# Patient Record
Sex: Female | Born: 1995 | Race: Black or African American | Hispanic: No | Marital: Single
Health system: Midwestern US, Community
[De-identification: ages and names within clinical notes are randomized; demographics above are authoritative.]

---

## 2014-12-10 ENCOUNTER — Inpatient Hospital Stay
Admit: 2014-12-10 | Discharge: 2014-12-10 | Disposition: A | Discharge status: Home / Self Care | Payer: BLUE CROSS/BLUE SHIELD | Attending: Pediatric Emergency Medicine

## 2014-12-10 DIAGNOSIS — R112 Nausea with vomiting, unspecified: Secondary | ICD-10-CM

## 2014-12-10 LAB — CBC WITH AUTOMATED DIFF
ABS. BASOPHILS: 0 10*3/uL (ref 0.0–0.1)
ABS. EOSINOPHILS: 0.1 10*3/uL (ref 0.0–0.4)
ABS. LYMPHOCYTES: 0.7 10*3/uL — ABNORMAL LOW (ref 0.8–3.5)
ABS. MONOCYTES: 0.5 10*3/uL (ref 0.0–1.0)
ABS. NEUTROPHILS: 3.2 10*3/uL (ref 1.8–8.0)
BASOPHILS: 0 % (ref 0–1)
EOSINOPHILS: 2 % (ref 0–7)
HCT: 38.5 % (ref 35.0–47.0)
HGB: 12.6 g/dL (ref 11.5–16.0)
LYMPHOCYTES: 16 % (ref 12–49)
MCH: 29.4 PG (ref 26.0–34.0)
MCHC: 32.7 g/dL (ref 30.0–36.5)
MCV: 90 FL (ref 80.0–99.0)
MONOCYTES: 12 % (ref 5–13)
NEUTROPHILS: 70 % (ref 32–75)
PLATELET: 187 10*3/uL (ref 150–400)
RBC: 4.28 M/uL (ref 3.80–5.20)
RDW: 14.1 % (ref 11.5–14.5)
WBC: 4.5 10*3/uL (ref 3.6–11.0)

## 2014-12-10 LAB — URINALYSIS W/ REFLEX CULTURE
Bilirubin: NEGATIVE
Blood: NEGATIVE
Glucose: NEGATIVE mg/dL
Ketone: NEGATIVE mg/dL
Nitrites: NEGATIVE
Protein: NEGATIVE mg/dL
Specific gravity: 1.024 (ref 1.003–1.030)
Urobilinogen: 1 EU/dL (ref 0.2–1.0)
pH (UA): 7 (ref 5.0–8.0)

## 2014-12-10 LAB — METABOLIC PANEL, COMPREHENSIVE
A-G Ratio: 0.9 — ABNORMAL LOW (ref 1.1–2.2)
ALT (SGPT): 14 U/L (ref 12–78)
AST (SGOT): 9 U/L — ABNORMAL LOW (ref 15–37)
Albumin: 3.5 g/dL (ref 3.5–5.0)
Alk. phosphatase: 64 U/L (ref 45–117)
Anion gap: 8 mmol/L (ref 5–15)
BUN/Creatinine ratio: 10 — ABNORMAL LOW (ref 12–20)
BUN: 7 MG/DL (ref 6–20)
Bilirubin, total: 1.6 MG/DL — ABNORMAL HIGH (ref 0.2–1.0)
CO2: 24 mmol/L (ref 21–32)
Calcium: 8.2 MG/DL — ABNORMAL LOW (ref 8.5–10.1)
Chloride: 107 mmol/L (ref 97–108)
Creatinine: 0.67 MG/DL (ref 0.55–1.02)
GFR est AA: >60 mL/min/{1.73_m2} (ref >60)
GFR est non-AA: >60 mL/min/{1.73_m2} (ref >60)
Globulin: 3.7 g/dL (ref 2.0–4.0)
Glucose: 94 mg/dL (ref 65–100)
Potassium: 3.7 mmol/L (ref 3.5–5.1)
Protein, total: 7.2 g/dL (ref 6.4–8.2)
Sodium: 139 mmol/L (ref 136–145)

## 2014-12-10 LAB — LIPASE: Lipase: 81 U/L (ref 73–393)

## 2014-12-10 LAB — HCG URINE, QL. - POC: Pregnancy test,urine (POC): NEGATIVE

## 2014-12-10 MED ORDER — ONDANSETRON 4 MG TAB, RAPID DISSOLVE
4 mg | ORAL_TABLET | Freq: Three times a day (TID) | ORAL | Status: AC | PRN
Start: 2014-12-10 — End: 2014-12-12

## 2014-12-10 MED ORDER — ONDANSETRON 4 MG TAB, RAPID DISSOLVE
4 mg | ORAL | Status: AC
Start: 2014-12-10 — End: 2014-12-10
  Administered 2014-12-10: 13:00:00 via ORAL

## 2014-12-10 MED ORDER — SODIUM CHLORIDE 0.9% BOLUS IV
0.9 % | Freq: Once | INTRAVENOUS | Status: AC
Start: 2014-12-10 — End: 2014-12-10
  Administered 2014-12-10: 16:00:00 via INTRAVENOUS

## 2014-12-10 MED ORDER — ONDANSETRON (PF) 4 MG/2 ML INJECTION
4 mg/2 mL | INTRAMUSCULAR | Status: AC
Start: 2014-12-10 — End: 2014-12-10
  Administered 2014-12-10: 16:00:00 via INTRAVENOUS

## 2014-12-10 MED FILL — ONDANSETRON 4 MG TAB, RAPID DISSOLVE: 4 mg | ORAL | Qty: 1

## 2014-12-10 MED FILL — SODIUM CHLORIDE 0.9 % IV: INTRAVENOUS | Qty: 1000

## 2014-12-10 MED FILL — ONDANSETRON (PF) 4 MG/2 ML INJECTION: 4 mg/2 mL | INTRAMUSCULAR | Qty: 2

## 2014-12-10 NOTE — ED Notes (Signed)
Started vomiting this morning. C/o lower back pain and fatigue. Unknown if has fever.

## 2014-12-10 NOTE — ED Notes (Signed)
Pt. Discharged home with parent/guardian.  Pt. Acting age appropriately and respirations regular and unlabored.  Parent/guardian verbalized understanding of discharge instructions and has no further questions at this time.

## 2014-12-10 NOTE — ED Notes (Signed)
Patient drinking apple juice and eating captain crunch.

## 2014-12-10 NOTE — ED Provider Notes (Signed)
HPI Comments: 19 yo woman with no PMH presents for evaluation of lower back pain and vomiting starting this morning.  No fevers, no dysuria, no diarrhea.  Emesis NB/NB.  Last BM yesterday.  Back pain mild to moderate, bilateral, dull, without radiation or sciatica.  Took no medications.  Denies vaginal discharge or pain, no abd pain.  No numbness, tingling or weakness.     LMP 1 month ago.  Denies sexual activity.  Family history unremarkable.    Patient is a 19 y.o. female presenting with vomiting.   Vomiting   Pertinent negatives include no fever, no abdominal pain, no diarrhea and no cough.        Past Medical History:   Diagnosis Date   ??? Ill-defined condition      uti       Past Surgical History:   Procedure Laterality Date   ??? Hx heent       tonsilectomy         History reviewed. No pertinent family history.    History     Social History   ??? Marital Status: SINGLE     Spouse Name: N/A   ??? Number of Children: N/A   ??? Years of Education: N/A     Occupational History   ??? Not on file.     Social History Main Topics   ??? Smoking status: Not on file   ??? Smokeless tobacco: Not on file   ??? Alcohol Use: Not on file   ??? Drug Use: Not on file   ??? Sexual Activity: Not on file     Other Topics Concern   ??? Not on file     Social History Narrative   ??? No narrative on file           ALLERGIES: Pcn; Peach flavor; and Strawberry      Review of Systems   Constitutional: Negative for fever and appetite change.   HENT: Negative for congestion and rhinorrhea.    Eyes: Negative for discharge and redness.   Respiratory: Negative for cough and shortness of breath.    Gastrointestinal: Positive for vomiting. Negative for nausea, abdominal pain and diarrhea.   Genitourinary: Negative for dysuria and decreased urine volume.   Musculoskeletal: Positive for back pain. Negative for neck pain and neck stiffness.   Skin: Negative for rash and wound.   Hematological: Does not bruise/bleed easily.    All other systems reviewed and are negative.      Filed Vitals:    12/10/14 0904   BP: 115/64   Pulse: 92   Temp: 98.2 ??F (36.8 ??C)   Resp: 18   Weight: 135.9 kg (299 lb 9.7 oz)   SpO2: 97%            Physical Exam   Constitutional: She is oriented to person, place, and time. She appears well-nourished. No distress.   HENT:   Head: Normocephalic and atraumatic.   Right Ear: External ear normal.   Left Ear: External ear normal.   Nose: Nose normal.   Mouth/Throat: Oropharynx is clear and moist. No oropharyngeal exudate.   Eyes: Conjunctivae and EOM are normal. Pupils are equal, round, and reactive to light. Right eye exhibits no discharge. Left eye exhibits no discharge. No scleral icterus.   Neck: Normal range of motion. Neck supple.   Cardiovascular: Normal rate, regular rhythm, normal heart sounds and intact distal pulses.  Exam reveals no gallop and no friction rub.    No  murmur heard.  Pulmonary/Chest: Effort normal and breath sounds normal. No respiratory distress. She has no wheezes. She has no rales. She exhibits no tenderness.   Abdominal: Soft. She exhibits no distension and no mass. Bowel sounds are increased. There is no tenderness. There is no rebound and no guarding.   Musculoskeletal: Normal range of motion. She exhibits no edema.   Neurological: She is alert and oriented to person, place, and time. She has normal strength. No cranial nerve deficit. She exhibits normal muscle tone.   Skin: Skin is warm and dry. No rash noted. She is not diaphoretic.   Psychiatric: She has a normal mood and affect. Her behavior is normal.   Nursing note and vitals reviewed.       MDM    Procedures    Pt was re-evaluated after zofran and IVF.  The patient has tolerated PO without further emesis.  Patient is well hydrated, well appearing, and in no respiratory distress. Physical exam is reassuring, and without signs of serious illness.  Symptoms likely secondary to a viral syndrome.  Will  discharge patient home with zofran, supportive care, and follow-up with PCP within the next few days.

## 2014-12-11 LAB — CULTURE, URINE
Colonies Counted: 70000
Colony Count: 70000

## 2015-11-27 ENCOUNTER — Inpatient Hospital Stay
Admit: 2015-11-27 | Discharge: 2015-11-27 | Disposition: A | Discharge status: Home / Self Care | Payer: BLUE CROSS/BLUE SHIELD | Attending: Emergency Medicine

## 2015-11-27 ENCOUNTER — Emergency Department: Admit: 2015-11-27 | Payer: BLUE CROSS/BLUE SHIELD

## 2015-11-27 DIAGNOSIS — J069 Acute upper respiratory infection, unspecified: Secondary | ICD-10-CM

## 2015-11-27 LAB — POC GROUP A STREP: Group A strep (POC): NEGATIVE

## 2015-11-27 LAB — URINALYSIS W/ REFLEX CULTURE
Bilirubin: NEGATIVE
Glucose: NEGATIVE mg/dL
Nitrites: NEGATIVE
Protein: NEGATIVE mg/dL
RBC: >100 /hpf — ABNORMAL HIGH (ref 0–5)
Specific gravity: 1.02 (ref 1.003–1.030)
Urobilinogen: 0.2 EU/dL (ref 0.2–1.0)
pH (UA): 6.5 (ref 5.0–8.0)

## 2015-11-27 LAB — HCG URINE, QL. - POC: Pregnancy test,urine (POC): NEGATIVE

## 2015-11-27 MED ORDER — TRIMETHOPRIM-SULFAMETHOXAZOLE 160 MG-800 MG TAB
160-800 mg | ORAL_TABLET | Freq: Two times a day (BID) | ORAL | 0 refills | Status: AC
Start: 2015-11-27 — End: 2015-12-04

## 2015-11-27 NOTE — ED Notes (Signed)
Patient verbalizes understanding of discharge instructions. Pt alert and oriented, appears in no acute distress, respirations equal and unlabored. Ambulatory upon discharge with steady gait.

## 2015-11-27 NOTE — ED Provider Notes (Signed)
HPI Comments: 20 yo female with hx of tonsillectomy here for evaluation of cough, congestion, ear ache, sore throat and chills over the past 3-4 days.  States fever of 101-102 at home.  Has been taking allergy meds and Dayquil.  +Cough; pain in chest with cough.   States she was told by job she had to be evaluated.    Denies SOB, abd pain, flank pain, urinary symptoms.   Non smoker.      Patient is a 20 y.o. female presenting with general illness. The history is provided by the patient.   Generalized Body Aches   The current episode started 2 days ago. The problem occurs constantly. Pertinent negatives include no abdominal pain and no shortness of breath. Nothing aggravates the symptoms. Nothing relieves the symptoms.        Past Medical History:   Diagnosis Date   ??? Ill-defined condition     uti       Past Surgical History:   Procedure Laterality Date   ??? HX HEENT      tonsilectomy         History reviewed. No pertinent family history.    Social History     Social History   ??? Marital status: SINGLE     Spouse name: N/A   ??? Number of children: N/A   ??? Years of education: N/A     Occupational History   ??? Not on file.     Social History Main Topics   ??? Smoking status: Not on file   ??? Smokeless tobacco: Not on file   ??? Alcohol use Not on file   ??? Drug use: Not on file   ??? Sexual activity: Not on file     Other Topics Concern   ??? Not on file     Social History Narrative         ALLERGIES: Pcn [penicillins]; Peach flavor; and Strawberry    Review of Systems   Constitutional: Positive for fever. Negative for activity change.   HENT: Positive for congestion, ear pain and sore throat. Negative for facial swelling.    Eyes: Negative for discharge.   Respiratory: Positive for cough. Negative for shortness of breath.    Cardiovascular: Negative for leg swelling.   Gastrointestinal: Negative for abdominal distention and abdominal pain.   Skin: Negative for color change.   Neurological: Negative for seizures and syncope.    Psychiatric/Behavioral: Negative for behavioral problems.       Vitals:    11/27/15 1813   BP: 130/79   Pulse: 76   Resp: 16   Temp: 98.6 ??F (37 ??C)   SpO2: 100%   Weight: 130.6 kg (288 lb)   Height: 5\' 7"  (1.702 m)            Physical Exam   Constitutional: She is oriented to person, place, and time. She appears well-developed and well-nourished.   HENT:   Head: Normocephalic and atraumatic.   Right Ear: External ear normal.   Left Ear: External ear normal.   Nose: Nose normal.   Mouth/Throat: Oropharynx is clear and moist. No oropharyngeal exudate.   Clear rhinorrhea   Eyes: Conjunctivae and EOM are normal. Pupils are equal, round, and reactive to light. Right eye exhibits no discharge. Left eye exhibits no discharge.   Neck: Normal range of motion. Neck supple.   No meningeal signs    Cardiovascular: Normal rate, regular rhythm, normal heart sounds and intact distal pulses.  Pulmonary/Chest: Effort normal and breath sounds normal.   Abdominal: Soft. Bowel sounds are normal. She exhibits no distension. There is no tenderness. There is no rebound and no guarding.   Musculoskeletal: Normal range of motion. She exhibits no edema or tenderness.   Lymphadenopathy:     She has no cervical adenopathy.   Neurological: She is alert and oriented to person, place, and time. No cranial nerve deficit. Coordination normal.   Skin: Skin is warm and dry. No rash noted.   Psychiatric: She has a normal mood and affect. Her behavior is normal. Judgment and thought content normal.   Nursing note and vitals reviewed.       MDM  Number of Diagnoses or Management Options  Acute upper respiratory infection:   Urinary tract infection with hematuria, site unspecified:      Amount and/or Complexity of Data Reviewed  Clinical lab tests: ordered and reviewed  Tests in the radiology section of CPT??: ordered and reviewed  Discuss the patient with other providers: yes      ED Course       Procedures       Patient has been reassessed.  Reviewed labs, medications and radiographics with patient.  Ready to discharge home.      Discussed case with attending Physician Marella Bile.  Agrees with care and will D/C with follow up.      Patient's results have been reviewed with them.  Patient and/or family have verbally conveyed their understanding and agreement of the patient's signs, symptoms, diagnosis, treatment and prognosis and additionally agree to follow up as recommended or return to the Emergency Room should their condition change prior to follow-up.  Discharge instructions have also been provided to the patient with some educational information regarding their diagnosis as well a list of reasons why they would want to return to the ER prior to their follow-up appointment should their condition change.  Veverly Fells, PA

## 2015-11-27 NOTE — Progress Notes (Signed)
rx bactrim, C&S pending

## 2015-11-27 NOTE — ED Notes (Signed)
PA reviewed discharge instructions with the patient.  The patient verbalized understanding.

## 2015-11-27 NOTE — ED Triage Notes (Signed)
+  nasal congestion. +right ear ache. +sore throat. +chills. +fever. Symptoms x 1 week.  Took allergy medication & dayquil & throat spray with no relief.  Also reports intermittent chest pain since last week

## 2015-11-27 NOTE — Progress Notes (Signed)
Treatment with bactrim appropriate per C&S

## 2015-11-29 LAB — CULTURE, URINE
Colonies Counted: >100000
Colony Count: >100000

## 2015-11-29 LAB — CULTURE, THROAT: Culture result:: NORMAL

## 2016-03-18 DIAGNOSIS — N760 Acute vaginitis: Secondary | ICD-10-CM

## 2016-03-18 NOTE — ED Notes (Addendum)
Assumed care of pt from triage. Pt complaining of chest pain this evening. Pt states "it felt like my heart was racing" and "I thought it might have been a panic attack". Pt also c/o urinary pain for a couple of days. Pt c/o burning with urination and difficulty urinating. Pt in no acute distress at this time. MD at bedside. Call bell within reach.

## 2016-03-18 NOTE — ED Notes (Signed)
Pt resting quietly and in no acute distress at this time. Family at bedside. Call bell within reach.

## 2016-03-18 NOTE — ED Provider Notes (Signed)
Patient is a 20 y.o. female presenting with chest pain and urinary pain. The history is provided by the patient.   Chest Pain (Angina)    This is a new problem. The current episode started 2 days ago. The problem has been gradually worsening. Associated symptoms include abdominal pain and shortness of breath. Pertinent negatives include no back pain, no cough, no diaphoresis, no dizziness, no exertional chest pressure, no fever, no headaches, no leg pain, no lower extremity edema, no nausea, no near-syncope, no numbness, no palpitations and no weakness. Risk factors include no risk factors. Her past medical history does not include DVT or PE.   Urinary Pain    Associated symptoms include frequency and abdominal pain. Pertinent negatives include no nausea, no vaginal discharge and no back pain.     20 yo F with no sig PMH who presents for evaluation of CP/SOB and dysuria.  CP/dyspnea symptoms have been happening intermittently for 2 days.  Denies any associated factors.  Exertion does not make it worse.  Pt thought it could be a panic attack and tried drinking water, which usually helps, but it did not help this time so she came to the ED.  Does report not sleeping well for the last few days but No personal or FH of blood clots.  No OCPs.  Also reports lower abdominal pain associated with dysuria and frequency.  No hematuria.  No pelvic discharge.  Does report pain/itching in her vaginal area.  Denies sexual activity/new sexual partners.      Past Medical History:   Diagnosis Date   ??? Ill-defined condition     uti       Past Surgical History:   Procedure Laterality Date   ??? HX HEENT      tonsilectomy         No family history on file.    Social History     Social History   ??? Marital status: SINGLE     Spouse name: N/A   ??? Number of children: N/A   ??? Years of education: N/A     Occupational History   ??? Not on file.     Social History Main Topics   ??? Smoking status: Not on file   ??? Smokeless tobacco: Not on file    ??? Alcohol use Not on file   ??? Drug use: Not on file   ??? Sexual activity: Not on file     Other Topics Concern   ??? Not on file     Social History Narrative         ALLERGIES: Pcn [penicillins]; Peach flavor; and Strawberry    Review of Systems   Constitutional: Negative for activity change, diaphoresis, fatigue and fever.   HENT: Negative for rhinorrhea and sore throat.    Respiratory: Positive for shortness of breath. Negative for cough.    Cardiovascular: Positive for chest pain. Negative for palpitations, leg swelling and near-syncope.   Gastrointestinal: Positive for abdominal pain. Negative for diarrhea and nausea.   Genitourinary: Positive for dysuria and frequency. Negative for difficulty urinating, vaginal bleeding and vaginal discharge.   Musculoskeletal: Negative for back pain.   Neurological: Negative for dizziness, syncope, weakness, numbness and headaches.   Psychiatric/Behavioral: Positive for sleep disturbance. The patient is nervous/anxious.        Vitals:    03/18/16 2147   BP: 124/73   Pulse: 70   Resp: 20   Temp: 98 ??F (36.7 ??C)   SpO2: 100%  Weight: 120 kg (264 lb 8.8 oz)   Height: 5' 7"  (1.702 m)            Physical Exam   Constitutional: She is oriented to person, place, and time. She appears well-developed and well-nourished. No distress.   HENT:   Head: Normocephalic and atraumatic.   Eyes: Conjunctivae are normal.   Cardiovascular: Normal rate and regular rhythm.    Pulmonary/Chest: Effort normal and breath sounds normal. No respiratory distress.   Abdominal: Soft. There is tenderness ( mild, suprapubic).   Musculoskeletal: Normal range of motion. She exhibits no edema or tenderness.   Neurological: She is alert and oriented to person, place, and time.   Skin: Skin is warm and dry. No rash noted. She is not diaphoretic.   Psychiatric: Her behavior is normal.   Tearful in room   Nursing note and vitals reviewed.       MDM  Number of Diagnoses or Management Options  Atypical chest pain:    Vaginitis and vulvovaginitis:   Diagnosis management comments: Ddx: PID, UTI, STI, ACS, atypical CP, PE, anxiety    20 yo F with no sig PMH who presents for evaluation of CP/SOB and dysuria. VS WNL.  For pt's CP/SOB, she is well appearing in no resp distress.  She is PERC negative and Wells score of 0, PE felt to be unlikely.  Trop is negative and EKG NI, ACS unlikely in setting of age, no risk factors, and normal work up.  Labs unrevealing. No UTI on UA.  Pelvic poorly tolerated so G/C urine sent which was negative.  Pt did admit to using feminine spray in her vaginal area which may be contributing to vaginitis.  Have instructed her to stop using this and to f/u with PCP.  Pt to be d/c to home with PCP f/u.      ED Course   PROGRESS NOTES:  Pt feels much better after having normal work up. Has been instructed to use only unscented soaps and not use any feminine sprays.  Discusses CP and SOB and patient now states she was feeling anxious but feels better after negative work up and with her mom and brother at bedside.  Pt to be d/c to home with PCP f/u. She understands and agrees with plan    Pelvic Exam  Date/Time: 03/19/2016 12:03 AM  Performed by: resident  Procedure duration:  10 minutes.  Type of exam performed: bimanual and speculum.    External genitalia appearance: normal.    Vagina findings: patient unable to tolerate speculum so exam was stopped before swabs could be obtained, pt had small amount of thin white vag discharge, likely physiologic     Cervical exam:  inadequately visualized.    Specimens collected: unable to swab.  Bimanual exam: no CMT.            Chief Complaint   Patient presents with   ??? Chest Pain     2 days ago ; pt reports pain as intermittent and left sided ; pt also reports abominal pain that is accompanying chest pain that is lower and radiating into left lower back    ??? Urinary Pain     onset yesterday        12:27 AM   The patients presenting problems have been discussed, and they are in agreement with the care plan formulated and outlined with them.  I have encouraged them to ask questions as they arise throughout their visit.  MEDICATIONS GIVEN:  Medications - No data to display    LABS REVIEWED:  Recent Results (from the past 24 hour(s))   EKG, 12 LEAD, INITIAL    Collection Time: 03/18/16  9:40 PM   Result Value Ref Range    Ventricular Rate 74 BPM    Atrial Rate 74 BPM    P-R Interval 140 ms    QRS Duration 78 ms    Q-T Interval 400 ms    QTC Calculation (Bezet) 444 ms    Calculated P Axis 43 degrees    Calculated R Axis 68 degrees    Calculated T Axis 48 degrees    Diagnosis       Normal sinus rhythm  Normal ECG  No previous ECGs available     URINALYSIS W/ REFLEX CULTURE    Collection Time: 03/18/16 10:10 PM   Result Value Ref Range    Color YELLOW/STRAW      Appearance CLOUDY (A) CLEAR      Specific gravity 1.011 1.003 - 1.030      pH (UA) 5.5 5.0 - 8.0      Protein NEGATIVE  NEG mg/dL    Glucose NEGATIVE  NEG mg/dL    Ketone 15 (A) NEG mg/dL    Bilirubin NEGATIVE  NEG      Blood NEGATIVE  NEG      Urobilinogen 0.2 0.2 - 1.0 EU/dL    Nitrites NEGATIVE  NEG      Leukocyte Esterase TRACE (A) NEG      WBC 5-10 0 - 4 /hpf    RBC 0-5 0 - 5 /hpf    Epithelial cells MANY (A) FEW /lpf    Bacteria 3+ (A) NEG /hpf    UA:UC IF INDICATED URINE CULTURE ORDERED (A) CNI      Mucus 1+ (A) NEG /lpf   CBC W/O DIFF    Collection Time: 03/18/16 10:46 PM   Result Value Ref Range    WBC 6.7 3.6 - 11.0 K/uL    RBC 4.52 3.80 - 5.20 M/uL    HGB 13.5 11.5 - 16.0 g/dL    HCT 40.3 35.0 - 47.0 %    MCV 89.2 80.0 - 99.0 FL    MCH 29.9 26.0 - 34.0 PG    MCHC 33.5 30.0 - 36.5 g/dL    RDW 13.5 11.5 - 14.5 %    PLATELET 226 150 - 427 K/uL   METABOLIC PANEL, COMPREHENSIVE    Collection Time: 03/18/16 10:46 PM   Result Value Ref Range    Sodium 134 (L) 136 - 145 mmol/L    Potassium 3.8 3.5 - 5.1 mmol/L    Chloride 104 97 - 108 mmol/L     CO2 26 21 - 32 mmol/L    Anion gap 4 (L) 5 - 15 mmol/L    Glucose 77 65 - 100 mg/dL    BUN 7 6 - 20 MG/DL    Creatinine 0.87 0.55 - 1.02 MG/DL    BUN/Creatinine ratio 8 (L) 12 - 20      GFR est AA >60 >60 ml/min/1.58m    GFR est non-AA >60 >60 ml/min/1.778m   Calcium 8.9 8.5 - 10.1 MG/DL    Bilirubin, total 1.1 (H) 0.2 - 1.0 MG/DL    ALT (SGPT) 13 12 - 78 U/L    AST (SGOT) 18 15 - 37 U/L    Alk. phosphatase 66 45 - 117 U/L    Protein, total 8.3 (H) 6.4 -  8.2 g/dL    Albumin 3.9 3.5 - 5.0 g/dL    Globulin 4.4 (H) 2.0 - 4.0 g/dL    A-G Ratio 0.9 (L) 1.1 - 2.2     CK W/ REFLX CKMB    Collection Time: 03/18/16 10:46 PM   Result Value Ref Range    CK 76 26 - 192 U/L   TROPONIN I    Collection Time: 03/18/16 10:46 PM   Result Value Ref Range    Troponin-I, Qt. <0.04 <0.05 ng/mL       VITAL SIGNS:  Patient Vitals for the past 12 hrs:   Temp Pulse Resp BP SpO2   03/18/16 2147 98 ??F (36.7 ??C) 70 20 124/73 100 %       RADIOLOGY RESULTS:  The following have been ordered and reviewed:  No orders to display       EKG interpretation: (Preliminary)  Rhythm: normal sinus rhythm; and regular . Rate (approx.): 74 Axis: normal; P wave: normal; QRS interval: normal ; ST/T wave: normal    PROCEDURES:  Pelvic exam      CONSULTATIONS:   none      DIAGNOSIS:    1. Vaginitis and vulvovaginitis    2. Atypical chest pain        PLAN:  Follow-up Information     Follow up With Details Comments Contact Info    Deniece Ree, MD In 3 days  [address]  [place] [address]  [phone]      [facility]  As needed, If symptoms worsen [place]  [phone]        Discharge Medication List as of 03/19/2016 12:19 AM

## 2016-03-19 ENCOUNTER — Inpatient Hospital Stay
Admit: 2016-03-19 | Discharge: 2016-03-19 | Disposition: A | Discharge status: Home / Self Care | Payer: BLUE CROSS/BLUE SHIELD | Attending: Emergency Medicine

## 2016-03-19 LAB — URINALYSIS W/ REFLEX CULTURE
Bilirubin: NEGATIVE
Blood: NEGATIVE
Glucose: NEGATIVE mg/dL
Ketone: 15 mg/dL — AB
Nitrites: NEGATIVE
Protein: NEGATIVE mg/dL
Specific gravity: 1.011 (ref 1.003–1.030)
Urobilinogen: 0.2 EU/dL (ref 0.2–1.0)
pH (UA): 5.5 (ref 5.0–8.0)

## 2016-03-19 LAB — METABOLIC PANEL, COMPREHENSIVE
A-G Ratio: 0.9 — ABNORMAL LOW (ref 1.1–2.2)
ALT (SGPT): 13 U/L (ref 12–78)
AST (SGOT): 18 U/L (ref 15–37)
Albumin: 3.9 g/dL (ref 3.5–5.0)
Alk. phosphatase: 66 U/L (ref 45–117)
Anion gap: 4 mmol/L — ABNORMAL LOW (ref 5–15)
BUN/Creatinine ratio: 8 — ABNORMAL LOW (ref 12–20)
BUN: 7 MG/DL (ref 6–20)
Bilirubin, total: 1.1 MG/DL — ABNORMAL HIGH (ref 0.2–1.0)
CO2: 26 mmol/L (ref 21–32)
Calcium: 8.9 MG/DL (ref 8.5–10.1)
Chloride: 104 mmol/L (ref 97–108)
Creatinine: 0.87 MG/DL (ref 0.55–1.02)
GFR est AA: >60 mL/min/{1.73_m2} (ref >60)
GFR est non-AA: >60 mL/min/{1.73_m2} (ref >60)
Globulin: 4.4 g/dL — ABNORMAL HIGH (ref 2.0–4.0)
Glucose: 77 mg/dL (ref 65–100)
Potassium: 3.8 mmol/L (ref 3.5–5.1)
Protein, total: 8.3 g/dL — ABNORMAL HIGH (ref 6.4–8.2)
Sodium: 134 mmol/L — ABNORMAL LOW (ref 136–145)

## 2016-03-19 LAB — CBC W/O DIFF
HCT: 40.3 % (ref 35.0–47.0)
HGB: 13.5 g/dL (ref 11.5–16.0)
MCH: 29.9 PG (ref 26.0–34.0)
MCHC: 33.5 g/dL (ref 30.0–36.5)
MCV: 89.2 FL (ref 80.0–99.0)
PLATELET: 226 10*3/uL (ref 150–400)
RBC: 4.52 M/uL (ref 3.80–5.20)
RDW: 13.5 % (ref 11.5–14.5)
WBC: 6.7 10*3/uL (ref 3.6–11.0)

## 2016-03-19 LAB — EKG, 12 LEAD, INITIAL
Atrial Rate: 74 {beats}/min
Calculated P Axis: 43 degrees
Calculated R Axis: 68 degrees
Calculated T Axis: 48 degrees
Diagnosis: NORMAL
P-R Interval: 140 ms
Q-T Interval: 400 ms
QRS Duration: 78 ms
QTC Calculation (Bezet): 444 ms
Ventricular Rate: 74 {beats}/min

## 2016-03-19 LAB — TROPONIN I: Troponin-I, Qt.: <0.04 ng/mL (ref <0.05)

## 2016-03-19 LAB — CK W/ REFLX CKMB: CK: 76 U/L (ref 26–192)

## 2016-03-19 LAB — EKG 12-LEAD
Atrial Rate: 74 {beats}/min
Diagnosis: NORMAL
P Axis: 43 degrees
P-R Interval: 140 ms
Q-T Interval: 400 ms
QRS Duration: 78 ms
QTc Calculation (Bazett): 444 ms
R Axis: 68 degrees
T Axis: 48 degrees
Ventricular Rate: 74 {beats}/min

## 2016-03-19 NOTE — Progress Notes (Signed)
Attempted to contact patient, message states "person is not accepting calls at this time". No ring, no option to leave VM. Sent letter.

## 2016-03-19 NOTE — Progress Notes (Signed)
Pt discharged without ABX. Seen for vaginitis. Final sensitivities pending.

## 2016-03-19 NOTE — ED Notes (Signed)
Provider at bedside for dispo and follow up. Discharge plan reviewed and paperwork signed, pain level within manageable comfortable limits, ambulatory to exit, gait steady, safety maintained.

## 2016-03-20 LAB — CHLAMYDIA/GC PCR
Chlamydia amplified: NEGATIVE
N. gonorrhea, amplified: NEGATIVE

## 2016-03-21 LAB — CULTURE, URINE
Colonies Counted: >100000
Colony Count: >100000

## 2016-10-05 ENCOUNTER — Emergency Department (HOSPITAL_COMMUNITY): Payer: Federal, State, Local not specified - PPO

## 2016-10-05 ENCOUNTER — Encounter (HOSPITAL_COMMUNITY): Payer: Self-pay | Admitting: Emergency Medicine

## 2016-10-05 ENCOUNTER — Emergency Department (HOSPITAL_COMMUNITY)
Admission: EM | Admit: 2016-10-05 | Discharge: 2016-10-06 | Disposition: A | Discharge status: Home / Self Care | Payer: Federal, State, Local not specified - PPO | Attending: Emergency Medicine | Admitting: Emergency Medicine

## 2016-10-05 DIAGNOSIS — R079 Chest pain, unspecified: Secondary | ICD-10-CM

## 2016-10-05 DIAGNOSIS — F17200 Nicotine dependence, unspecified, uncomplicated: Secondary | ICD-10-CM | POA: Diagnosis not present

## 2016-10-05 LAB — I-STAT TROPONIN, ED: Troponin i, poc: 0 ng/mL (ref 0.00–0.08)

## 2016-10-05 LAB — BASIC METABOLIC PANEL
Anion gap: 9 (ref 5–15)
BUN: 7 mg/dL (ref 6–20)
CHLORIDE: 107 mmol/L (ref 101–111)
CO2: 24 mmol/L (ref 22–32)
Calcium: 9.7 mg/dL (ref 8.9–10.3)
Creatinine, Ser: 0.96 mg/dL (ref 0.44–1.00)
GFR calc Af Amer: >60 mL/min (ref >60)
GFR calc non Af Amer: >60 mL/min (ref >60)
Glucose, Bld: 95 mg/dL (ref 65–99)
POTASSIUM: 3.2 mmol/L — AB (ref 3.5–5.1)
SODIUM: 140 mmol/L (ref 135–145)

## 2016-10-05 LAB — CBC
HCT: 40.1 % (ref 36.0–46.0)
Hemoglobin: 13.8 g/dL (ref 12.0–15.0)
MCH: 30.2 pg (ref 26.0–34.0)
MCHC: 34.4 g/dL (ref 30.0–36.0)
MCV: 87.7 fL (ref 78.0–100.0)
Platelets: 228 10*3/uL (ref 150–400)
RBC: 4.57 MIL/uL (ref 3.87–5.11)
RDW: 13.8 % (ref 11.5–15.5)
WBC: 6.3 10*3/uL (ref 4.0–10.5)

## 2016-10-05 LAB — D-DIMER, QUANTITATIVE (NOT AT ARMC): D DIMER QUANT: 0.73 ug{FEU}/mL — AB (ref 0.00–0.50)

## 2016-10-05 MED ORDER — IOPAMIDOL (ISOVUE-370) INJECTION 76%
INTRAVENOUS | Status: AC
  Administered 2016-10-06: 100 mL via INTRAVENOUS
  Filled 2016-10-05: qty 100

## 2016-10-05 MED ORDER — ALBUTEROL SULFATE (2.5 MG/3ML) 0.083% IN NEBU
5.0000 mg | INHALATION_SOLUTION | Freq: Once | RESPIRATORY_TRACT | Status: AC
  Administered 2016-10-05: 5 mg via RESPIRATORY_TRACT
  Filled 2016-10-05: qty 6

## 2016-10-05 MED ORDER — POTASSIUM CHLORIDE CRYS ER 20 MEQ PO TBCR
40.0000 meq | EXTENDED_RELEASE_TABLET | Freq: Once | ORAL | Status: AC
  Administered 2016-10-05: 40 meq via ORAL
  Filled 2016-10-05: qty 2

## 2016-10-05 MED ORDER — IPRATROPIUM BROMIDE 0.02 % IN SOLN
0.5000 mg | Freq: Once | RESPIRATORY_TRACT | Status: AC
  Administered 2016-10-05: 0.5 mg via RESPIRATORY_TRACT
  Filled 2016-10-05: qty 2.5

## 2016-10-05 MED ORDER — IOPAMIDOL (ISOVUE-370) INJECTION 76%
100.0000 mL | Freq: Once | INTRAVENOUS | Status: AC | PRN
  Administered 2016-10-06: 100 mL via INTRAVENOUS

## 2016-10-05 NOTE — ED Provider Notes (Signed)
[facility]-EMERGENCY DEPT Provider Note   CSN: [id number] Arrival date & time: 10/05/16  1650     History   Chief Complaint Chief Complaint  Patient presents with  . Chest Pain  . Shortness of Breath    HPI Rebecca Higgins is a 21 y.o. female.  21 year old female presents with dyspnea as well as sharp chest pain is worse with coughing times past 4 days. Does endorse recent URI symptoms. States that she has been more dyspneic on exertion but denies any orthopnea. No leg pain or swelling. Does not take any birth control pills. No fever or chills. Symptoms have been persistent nothing makes them better. No treatment use prior to arrival      History reviewed. No pertinent past medical history.  There are no active problems to display for this patient.   History reviewed. No pertinent surgical history.  OB History    No data available       Home Medications    Prior to Admission medications   Medication Sig Start Date End Date Taking? Authorizing Provider  cetirizine (ZYRTEC) 10 MG tablet Take 10 mg by mouth daily.   Yes Historical Provider, MD    Family History History reviewed. No pertinent family history.  Social History Social History  Substance Use Topics  . Smoking status: Current Some Day Smoker  . Smokeless tobacco: Never Used  . Alcohol use No     Allergies   Other; Strawberry flavor; and Penicillins   Review of Systems Review of Systems  All other systems reviewed and are negative.    Physical Exam Updated Vital Signs BP (!) 142/88 (BP Location: Left Arm)   Pulse (!) 101   Temp 98.6 F (37 C) (Oral)   Resp 18   Ht 5\' 6"  (1.676 m)   Wt 104.3 kg   LMP 09/17/2016   SpO2 97%   BMI 37.12 kg/m   Physical Exam  Constitutional: She is oriented to person, place, and time. She appears well-developed and well-nourished.  Non-toxic appearance. No distress.  HENT:  Head: Normocephalic and atraumatic.  Eyes: Conjunctivae, EOM and lids are normal.  Pupils are equal, round, and reactive to light.  Neck: Normal range of motion. Neck supple. No tracheal deviation present. No thyroid mass present.  Cardiovascular: Normal rate, regular rhythm and normal heart sounds.  Exam reveals no gallop.   No murmur heard. Pulmonary/Chest: Effort normal and breath sounds normal. No stridor. No respiratory distress. She has no decreased breath sounds. She has no wheezes. She has no rhonchi. She has no rales.  Abdominal: Soft. Normal appearance and bowel sounds are normal. She exhibits no distension. There is no tenderness. There is no rebound and no CVA tenderness.  Musculoskeletal: Normal range of motion. She exhibits no edema or tenderness.  Neurological: She is alert and oriented to person, place, and time. She has normal strength. No cranial nerve deficit or sensory deficit. GCS eye subscore is 4. GCS verbal subscore is 5. GCS motor subscore is 6.  Skin: Skin is warm and dry. No abrasion and no rash noted.  Psychiatric: She has a normal mood and affect. Her speech is normal and behavior is normal.  Nursing note and vitals reviewed.    ED Treatments / Results  Labs (all labs ordered are listed, but only abnormal results are displayed) Labs Reviewed  BASIC METABOLIC PANEL - Abnormal; Notable for the following:       Result Value   Potassium 3.2 (*)  All other components within normal limits  CBC  D-DIMER, QUANTITATIVE (NOT AT [facility])  I-STAT TROPOININ, ED    EKG  EKG Interpretation  Date/Time:  Monday October 05 2016 17:00:27 EDT Ventricular Rate:  98 PR Interval:    QRS Duration: 75 QT Interval:  348 QTC Calculation: 445 R Axis:   77 Text Interpretation:  Sinus rhythm Prominent P waves, nondiagnostic Borderline T wave abnormalities No old tracing to compare Confirmed by Ethelda Chick  MD, SAM [phone]) on 10/05/2016 5:12:02 PM       Radiology Dg Chest 2 View  Result Date: 10/05/2016 CLINICAL DATA:  Left side chest pain and shortness of  breath for 4 days EXAM: CHEST  2 VIEW COMPARISON:  None. FINDINGS: Cardiomediastinal silhouette is unremarkable. No infiltrate or pleural effusion. No pulmonary edema. Bony thorax is unremarkable. IMPRESSION: No active cardiopulmonary disease. Electronically Signed   By: Natasha Mead M.D.   On: 10/05/2016 17:17    Procedures Procedures (including critical care time)  Medications Ordered in ED Medications  potassium chloride SA (K-DUR,KLOR-CON) CR tablet 40 mEq (not administered)  albuterol (PROVENTIL) (2.5 MG/3ML) 0.083% nebulizer solution 5 mg (not administered)  ipratropium (ATROVENT) nebulizer solution 0.5 mg (not administered)     Initial Impression / Assessment and Plan / ED Course  I have reviewed the triage vital signs and the nursing notes.  Pertinent labs & imaging results that were available during my care of the patient were reviewed by me and considered in my medical decision making (see chart for details).     CT of the chest negative for PE. Albuterol with Atrovent given and patient feels better. Patient has what sound like a panic attack when she was in the CT scanner. Feels better at this time. Patient given Solu-Medrol Return precautions given  Final Clinical Impressions(s) / ED Diagnoses   Final diagnoses:  None    New Prescriptions New Prescriptions   No medications on file     Lorre Nick, MD 10/06/16 [phone]

## 2016-10-05 NOTE — ED Triage Notes (Signed)
Pt reports CP and SOB for the past 4 days. Recently got over a sinus infection.

## 2016-10-06 MED ORDER — ALBUTEROL SULFATE HFA 108 (90 BASE) MCG/ACT IN AERS
2.0000 | INHALATION_SPRAY | RESPIRATORY_TRACT | Status: DC
  Administered 2016-10-06: 2 via RESPIRATORY_TRACT
  Filled 2016-10-06: qty 6.7

## 2016-10-06 MED ORDER — PREDNISONE 50 MG PO TABS: ORAL_TABLET | ORAL | 0 refills | Status: DC

## 2016-10-06 MED ORDER — ONDANSETRON 8 MG PO TBDP: 8.0000 mg | ORAL_TABLET | Freq: Three times a day (TID) | ORAL | 0 refills | Status: DC | PRN

## 2016-10-06 MED ORDER — ONDANSETRON HCL 4 MG/2ML IJ SOLN
4.0000 mg | Freq: Once | INTRAMUSCULAR | Status: AC
  Administered 2016-10-06: 4 mg via INTRAVENOUS
  Filled 2016-10-06: qty 2

## 2016-10-06 MED ORDER — METHYLPREDNISOLONE SODIUM SUCC 125 MG IJ SOLR
125.0000 mg | Freq: Once | INTRAMUSCULAR | Status: AC
  Administered 2016-10-06: 125 mg via INTRAVENOUS
  Filled 2016-10-06: qty 2

## 2016-10-06 NOTE — Discharge Instructions (Signed)
Use 1 or 2 puffs of albuterol every 4-6 hours when necessary

## 2017-02-15 ENCOUNTER — Ambulatory Visit: Payer: Federal, State, Local not specified - PPO | Admitting: Physician Assistant

## 2017-02-25 ENCOUNTER — Emergency Department (HOSPITAL_COMMUNITY)
Admission: EM | Admit: 2017-02-25 | Discharge: 2017-02-25 | Disposition: A | Discharge status: Home / Self Care | Payer: Federal, State, Local not specified - PPO | Attending: Emergency Medicine | Admitting: Emergency Medicine

## 2017-02-25 ENCOUNTER — Encounter (HOSPITAL_COMMUNITY): Payer: Self-pay

## 2017-02-25 DIAGNOSIS — Z7983 Long term (current) use of bisphosphonates: Secondary | ICD-10-CM | POA: Diagnosis not present

## 2017-02-25 DIAGNOSIS — J069 Acute upper respiratory infection, unspecified: Secondary | ICD-10-CM | POA: Insufficient documentation

## 2017-02-25 DIAGNOSIS — B9789 Other viral agents as the cause of diseases classified elsewhere: Secondary | ICD-10-CM | POA: Insufficient documentation

## 2017-02-25 DIAGNOSIS — J029 Acute pharyngitis, unspecified: Secondary | ICD-10-CM | POA: Diagnosis present

## 2017-02-25 DIAGNOSIS — F17210 Nicotine dependence, cigarettes, uncomplicated: Secondary | ICD-10-CM | POA: Diagnosis not present

## 2017-02-25 DIAGNOSIS — Z79899 Other long term (current) drug therapy: Secondary | ICD-10-CM | POA: Diagnosis not present

## 2017-02-25 LAB — RAPID STREP SCREEN (MED CTR MEBANE ONLY): STREPTOCOCCUS, GROUP A SCREEN (DIRECT): NEGATIVE

## 2017-02-25 MED ORDER — OXYMETAZOLINE HCL 0.05 % NA SOLN
1.0000 | Freq: Once | NASAL | Status: AC
  Administered 2017-02-25: 1 via NASAL
  Filled 2017-02-25: qty 15

## 2017-02-25 MED ORDER — PSEUDOEPHEDRINE HCL 30 MG PO TABS
30.0000 mg | ORAL_TABLET | ORAL | 0 refills | Status: DC | PRN
Start: 2017-02-25 — End: 2017-04-22

## 2017-02-25 NOTE — Discharge Instructions (Signed)
Return to the ED with any concerns including difficulty breathing, vomiting and not able to keep down liquids, decreased urine output, decreased level of alertness/lethargy, or any other alarming symptoms   Use afrin nasal spray 1-2 sprays in each side of nose twice daily- do not use for more than 3 days or it could make your congestion worse

## 2017-02-25 NOTE — ED Provider Notes (Signed)
[facility]-EMERG[facility]Y DEPT Provider Note   CSN: [id number] Arrival date & time: 02/25/17  1326     History   Chief Complaint Chief Complaint  Patient presents with  . Sore Throat    HPI Rebecca Higgins is a 21 y.o. female.  HPI  Pt presenting with c/o sore throat- pt reports that symptoms began approx 5-6 weeks ago, describes a burning in her throat that is worse in the morning.  Yesterday she also began to have runny nose and subjective fever.  No vomiting, no difficulty breathing. No chest pain.  Has continued to drink liquids well.   Immunizations are up to date.  No recent travel. There are no other associated systemic symptoms, there are no other alleviating or modifying factors.   History reviewed. No pertinent past medical history.  There are no active problems to display for this patient.   History reviewed. No pertinent surgical history.  OB History    No data available       Home Medications    Prior to Admission medications   Medication Sig Start Date End Date Taking? Authorizing Provider  cetirizine (ZYRTEC) 10 MG tablet Take 10 mg by mouth daily.    [provider]  ondansetron (ZOFRAN ODT) 8 MG disintegrating tablet Take 1 tablet (8 mg total) by mouth every 8 (eight) hours as needed for nausea. 10/06/16   Derwood Kaplan, MD  predniSONE (DELTASONE) 50 MG tablet One by mouth daily 5 10/06/16   Lorre Nick, MD  pseudoephedrine (SUDAFED) 30 MG tablet Take 1 tablet (30 mg total) by mouth every 4 (four) hours as needed for congestion. 02/25/17   Raciel Caffrey, Latanya Maudlin, MD    Family History No family history on file.  Social History Social History  Substance Use Topics  . Smoking status: Current Some Day Smoker  . Smokeless tobacco: Never Used  . Alcohol use No     Allergies   Other; Strawberry flavor; and Penicillins   Review of Systems Review of Systems  ROS reviewed and all otherwise negative except for mentioned in HPI   Physical Exam Updated Vital  Signs BP 97/60 (BP Location: Left Arm)   Pulse 60   Temp 98 F (36.7 C) (Oral)   Resp 16   LMP 01/31/2017 (Within Days)   SpO2 100%  Vitals reviewed Physical Exam  Physical Examination: General appearance - alert, well appearing, and in no distress Mental status - alert, oriented to person, place, and time Eyes - no conjunctival injection, no scleral icterus Mouth - mucous membranes moist, pharynx normal without lesions, mild erythema of OP, no exudate, uvula midline, palate symmetric Neck - supple, no significant adenopathy Chest - clear to auscultation, no wheezes, rales or rhonchi, symmetric air entry Heart - normal rate, regular rhythm, normal S1, S2, no murmurs, rubs, clicks or gallops Abdomen - soft, nontender, nondistended, no masses or organomegaly Neurological - alert, oriented, normal speech Extremities - peripheral pulses normal, no pedal edema, no clubbing or cyanosis Skin - normal coloration and turgor, no rashes   ED Treatments / Results  Labs (all labs ordered are listed, but only abnormal results are displayed) Labs Reviewed  RAPID STREP SCREEN (NOT AT [facility])  CULTURE, GROUP A STREP [facility])    EKG  EKG Interpretation None       Radiology No results found.  Procedures Procedures (including critical care time)  Medications Ordered in ED Medications  oxymetazoline (AFRIN) 0.05 % nasal spray 1 spray (1 spray Each Nare Given 02/25/17  1549)     Initial Impression / Assessment and Plan / ED Course  I have reviewed the triage vital signs and the nursing notes.  Pertinent labs & imaging results that were available during my care of the patient were reviewed by me and considered in my medical decision making (see chart for details).     Pt presenting with c/o sore throat and congestion.   Patient is overall nontoxic and well hydrated in appearance.  Rapid strep is negative, no signs of PTA, lungs are clear, respiratory effort is normal.  Pt treated with  afrin, given rx for decongestant, discussed other symptomatic treatments.  Pt discharged with strict return precautions.  Mom agreeable with plan  Final Clinical Impressions(s) / ED Diagnoses   Final diagnoses:  Viral URI with cough    New Prescriptions Discharge Medication List as of 02/25/2017  3:37 PM    START taking these medications   Details  pseudoephedrine (SUDAFED) 30 MG tablet Take 1 tablet (30 mg total) by mouth every 4 (four) hours as needed for congestion., Starting Thu 02/25/2017, Print         Shantella Blubaugh, Latanya MaudlinMartha L, MD 02/27/17 [phone]

## 2017-02-25 NOTE — ED Triage Notes (Signed)
Pt presents for evaluation of URI symptoms with sore throat since Saturday. Pt reports runny nose and fever yesterday. Describes pain in throat as burning sensation. Pt reports taking benadryl and allergy medicine.

## 2017-02-28 LAB — CULTURE, GROUP A STREP (THRC)

## 2017-04-18 ENCOUNTER — Emergency Department (HOSPITAL_COMMUNITY)
Admission: EM | Admit: 2017-04-18 | Discharge: 2017-04-18 | Disposition: A | Discharge status: Home / Self Care | Payer: Federal, State, Local not specified - PPO | Attending: Emergency Medicine | Admitting: Emergency Medicine

## 2017-04-18 ENCOUNTER — Encounter (HOSPITAL_COMMUNITY): Payer: Self-pay | Admitting: Emergency Medicine

## 2017-04-18 DIAGNOSIS — Z79899 Other long term (current) drug therapy: Secondary | ICD-10-CM | POA: Diagnosis not present

## 2017-04-18 DIAGNOSIS — R0981 Nasal congestion: Secondary | ICD-10-CM | POA: Diagnosis present

## 2017-04-18 DIAGNOSIS — J069 Acute upper respiratory infection, unspecified: Secondary | ICD-10-CM | POA: Diagnosis not present

## 2017-04-18 MED ORDER — FLUTICASONE PROPIONATE 50 MCG/ACT NA SUSP: 1.0000 | Freq: Every day | NASAL | 0 refills | Status: DC

## 2017-04-18 MED ORDER — DEXAMETHASONE SODIUM PHOSPHATE 10 MG/ML IJ SOLN
10.0000 mg | Freq: Once | INTRAMUSCULAR | Status: AC
  Administered 2017-04-18: 10 mg via INTRAMUSCULAR
  Filled 2017-04-18: qty 1

## 2017-04-18 MED ORDER — BENZONATATE 100 MG PO CAPS: 100.0000 mg | ORAL_CAPSULE | Freq: Three times a day (TID) | ORAL | 0 refills | Status: DC

## 2017-04-18 NOTE — ED Provider Notes (Signed)
[facility]-EMER[facility]ENCY DEPT Provider Note   CSN: [id number] Arrival date & time: 04/18/17  1412     History   Chief Complaint Chief Complaint  Patient presents with  . Nasal Congestion  . Cough    HPI Rebecca Higgins is a 21 y.o. female presenting with 1 week cough and congestion.  Patient states that for the past week, she's had nasal congestion and a persistent cough. Cough is occasionally productive, but usually nonproductive. She reports intermittent fevers. She has been using over-the-counter medicines including Mucinex and a sinus pill without relief. She states she often gets sinus infections this time of year. She reports she initially had sinus pressure in the frontal part of her head, but this has resolved. She denies sick contacts. She denies sore throat, ear pain, eye pain, chest pain, shortness of breath, nausea, vomiting, or abdominal pain. She has no other medical problems, does not take medications daily.   HPI  History reviewed. No pertinent past medical history.  There are no active problems to display for this patient.   History reviewed. No pertinent surgical history.  OB History    No data available       Home Medications    Prior to Admission medications   Medication Sig Start Date End Date Taking? Authorizing Provider  benzonatate (TESSALON) 100 MG capsule Take 1 capsule (100 mg total) by mouth every 8 (eight) hours. 04/18/17   Hyman Crossan, PA-C  cetirizine (ZYRTEC) 10 MG tablet Take 10 mg by mouth daily.    [provider]  fluticasone (FLONASE) 50 MCG/ACT nasal spray Place 1 spray into both nostrils daily. 04/18/17   Chellsie Gomer, PA-C  ondansetron (ZOFRAN ODT) 8 MG disintegrating tablet Take 1 tablet (8 mg total) by mouth every 8 (eight) hours as needed for nausea. 10/06/16   Derwood Kaplan, MD  predniSONE (DELTASONE) 50 MG tablet One by mouth daily 5 10/06/16   Lorre Nick, MD  pseudoephedrine (SUDAFED) 30 MG tablet Take 1 tablet (30  mg total) by mouth every 4 (four) hours as needed for congestion. 02/25/17   Mabe, Latanya Maudlin, MD    Family History History reviewed. No pertinent family history.  Social History Social History  Substance Use Topics  . Smoking status: Current Some Day Smoker  . Smokeless tobacco: Never Used  . Alcohol use Yes     Allergies   Other; Strawberry flavor; and Penicillins   Review of Systems Review of Systems  Constitutional: Positive for fever (Intermittent. Not currently).  HENT: Positive for congestion, rhinorrhea and sinus pressure (Resolved). Negative for sore throat.   Respiratory: Positive for cough. Negative for chest tightness, shortness of breath and wheezing.   Cardiovascular: Negative for chest pain and palpitations.  Gastrointestinal: Negative for abdominal pain, nausea and vomiting.     Physical Exam Updated Vital Signs BP (!) 99/58 (BP Location: Left Arm)   Pulse 87   Temp 98.2 F (36.8 C) (Oral)   Resp 16   Ht  (1.702 m)   LMP 04/17/2017   SpO2 98%   Physical Exam  Constitutional: She is oriented to person, place, and time. She appears well-developed and well-nourished. No distress.  HENT:  Head: Normocephalic and atraumatic.  Right Ear: Tympanic membrane, external ear and ear canal normal.  Left Ear: Tympanic membrane, external ear and ear canal normal.  Nose: Mucosal edema present. Right sinus exhibits no maxillary sinus tenderness and no frontal sinus tenderness. Left sinus exhibits no maxillary sinus tenderness and no frontal  sinus tenderness.  Mouth/Throat: Uvula is midline, oropharynx is clear and moist and mucous membranes are normal.  Eyes: Pupils are equal, round, and reactive to light. Conjunctivae and EOM are normal. Right eye exhibits no discharge. Left eye exhibits no discharge.  Neck: Normal range of motion.  Cardiovascular: Normal rate, regular rhythm and intact distal pulses.   Pulmonary/Chest: Effort normal and breath sounds normal. No  respiratory distress. She has no wheezes. She exhibits no tenderness.  Patient speaking in full sentences without difficulty. Good air movement in all lung fields.  Abdominal: Soft. She exhibits no distension. There is no tenderness.  Musculoskeletal: Normal range of motion.  Lymphadenopathy:    She has no cervical adenopathy.  Neurological: She is alert and oriented to person, place, and time.  Skin: Skin is warm. No rash noted.  Psychiatric: She has a normal mood and affect.  Nursing note and vitals reviewed.    ED Treatments / Results  Labs (all labs ordered are listed, but only abnormal results are displayed) Labs Reviewed - No data to display  EKG  EKG Interpretation None       Radiology No results found.  Procedures Procedures (including critical care time)  Medications Ordered in ED Medications  dexamethasone (DECADRON) injection 10 mg (not administered)     Initial Impression / Assessment and Plan / ED Course  I have reviewed the triage vital signs and the nursing notes.  Pertinent labs & imaging results that were available during my care of the patient were reviewed by me and considered in my medical decision making (see chart for details).     Patient presented with one week of cough and congestion. Physical exam reassuring, as patient is afebrile and not tachycardic. Lung exam clear. Doubt pneumonia or other bacterial infection at this time. Discussed with patient course of viral illness and symptomatic treatment. Will give Decadron shot today and Flonase and Tessalon Perles for symptom relief. At this time, patient appears safe for discharge. Return precautions given. Patient states she understands and agrees to plan.  Final Clinical Impressions(s) / ED Diagnoses   Final diagnoses:  Upper respiratory tract infection, unspecified type    New Prescriptions New Prescriptions   BENZONATATE (TESSALON) 100 MG CAPSULE    Take 1 capsule (100 mg total) by  mouth every 8 (eight) hours.   FLUTICASONE (FLONASE) 50 MCG/ACT NASAL SPRAY    Place 1 spray into both nostrils daily.     Alveria Apley, PA-C 04/18/17 1724    Rolland Porter, MD 04/26/17 [phone]

## 2017-04-18 NOTE — ED Triage Notes (Addendum)
Pt states she has had a fever, nasal congestion and hot/ cold chills for almost a week. Pt has cough as well.

## 2017-04-18 NOTE — Discharge Instructions (Signed)
Use Flonase daily to help with nasal congestion and sinus pressure. Use Tessalon Perles as needed for cough. Stay well-hydrated with water. Tylenol or ibuprofen as needed for pain or fever. You may follow-up with [facility] in one week if your symptoms are not improving. You may also establish primary care using the [phone] number in the back of the paperwork. Return to the emergency room if you develop difficulty breathing, persistent chest pain, worsening symptoms, or any new or concerning symptoms.

## 2017-04-18 NOTE — ED Notes (Signed)
Declined W/C at D/C and was escorted to lobby by RN. 

## 2017-04-22 ENCOUNTER — Encounter (HOSPITAL_COMMUNITY): Payer: Self-pay | Admitting: Emergency Medicine

## 2017-04-22 ENCOUNTER — Emergency Department (HOSPITAL_COMMUNITY)
Admission: EM | Admit: 2017-04-22 | Discharge: 2017-04-22 | Disposition: A | Discharge status: Home / Self Care | Payer: Federal, State, Local not specified - PPO | Attending: Emergency Medicine | Admitting: Emergency Medicine

## 2017-04-22 DIAGNOSIS — L509 Urticaria, unspecified: Secondary | ICD-10-CM | POA: Diagnosis not present

## 2017-04-22 DIAGNOSIS — Z79899 Other long term (current) drug therapy: Secondary | ICD-10-CM | POA: Diagnosis not present

## 2017-04-22 DIAGNOSIS — F17200 Nicotine dependence, unspecified, uncomplicated: Secondary | ICD-10-CM | POA: Diagnosis not present

## 2017-04-22 DIAGNOSIS — R070 Pain in throat: Secondary | ICD-10-CM | POA: Diagnosis present

## 2017-04-22 MED ORDER — DIPHENHYDRAMINE HCL 25 MG PO TABS: 25.0000 mg | ORAL_TABLET | Freq: Four times a day (QID) | ORAL | 0 refills | Status: DC

## 2017-04-22 MED ORDER — PREDNISONE 20 MG PO TABS: ORAL_TABLET | ORAL | 0 refills | Status: DC

## 2017-04-22 MED ORDER — DIPHENHYDRAMINE HCL 25 MG PO CAPS
50.0000 mg | ORAL_CAPSULE | Freq: Once | ORAL | Status: AC
  Administered 2017-04-22: 50 mg via ORAL
  Filled 2017-04-22: qty 2

## 2017-04-22 MED ORDER — FAMOTIDINE 20 MG PO TABS
20.0000 mg | ORAL_TABLET | Freq: Once | ORAL | Status: AC
  Administered 2017-04-22: 20 mg via ORAL
  Filled 2017-04-22: qty 1

## 2017-04-22 MED ORDER — PREDNISONE 20 MG PO TABS
60.0000 mg | ORAL_TABLET | Freq: Once | ORAL | Status: AC
  Administered 2017-04-22: 60 mg via ORAL
  Filled 2017-04-22: qty 3

## 2017-04-22 NOTE — ED Provider Notes (Signed)
[facility] DEPT Provider Note   CSN: [id number] Arrival date & time: 04/22/17  1409     History   Chief Complaint Chief Complaint  Patient presents with  . Allergic Reaction    HPI Rebecca Higgins is a 21 y.o. female.  HPI   21 year old female presenting for evaluation of allergic reaction. Patient reports she developed cold symptoms for week was seen in the ER 4 days ago subsequently diagnosed with having a viral infection. She was given a shot of steroid, and was discharged home with Tessalon and Flonase. She did not take either one of medication but continue to use her home medication including Sudafed, and Zyrtec. Yesterday she developed throat irritation, worse with swallowing. She was concern for throat infection and decided to come to ER for further evaluation. While in the ER, the nurse noticed that patient has several red patch of skin changes throughout body. Patient states symptoms just started, it is itching. She denies having fever, chills, lightheadedness, dizziness, tongue swelling, chest pain, shortness of breath, wheezing or abdominal cramping. She denies any other environmental changes, new pets or other new medication. She does admits to eating seafood including shrimp and crab cakes for the past several days but has never had any problem with seafood in the past. 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  History reviewed. No pertinent past medical history.  There are no active problems to display for this patient.   History reviewed. No pertinent surgical history.  OB History    No data available       Home Medications    Prior to Admission medications   Medication Sig Start Date End Date Taking? Authorizing Provider  benzonatate (TESSALON) 100 MG capsule Take 1 capsule (100 mg total) by mouth every 8 (eight) hours. 04/18/17   Caccavale, Sophia, PA-C  cetirizine (ZYRTEC) 10 MG tablet Take 10 mg by mouth daily.     [provider]  fluticasone (FLONASE) 50 MCG/ACT nasal spray Place 1 spray into both nostrils daily. 04/18/17   Caccavale, Sophia, PA-C  ondansetron (ZOFRAN ODT) 8 MG disintegrating tablet Take 1 tablet (8 mg total) by mouth every 8 (eight) hours as needed for nausea. 10/06/16   Derwood Kaplan, MD  predniSONE (DELTASONE) 50 MG tablet One by mouth daily 5 10/06/16   Lorre Nick, MD  pseudoephedrine (SUDAFED) 30 MG tablet Take 1 tablet (30 mg total) by mouth every 4 (four) hours as needed for congestion. 02/25/17   Mabe, Latanya Maudlin, MD    Family History No family history on file.  Social History Social History  Substance Use Topics  . Smoking status: Current Some Day Smoker  . Smokeless tobacco: Never Used  . Alcohol use Yes     Allergies   Other; Strawberry flavor; and Penicillins   Review of Systems Review of Systems  All other systems reviewed and are negative.    Physical Exam Updated Vital Signs BP 105/79 (BP Location: Right Arm)   Pulse 87   Temp 98.2 F (36.8 C) (Oral)   Resp 18   LMP 04/17/2017   SpO2 98%   Physical Exam  Constitutional: She is oriented to person, place, and time. She appears well-developed and well-nourished. No distress.  HENT:  Head: Atraumatic.  Right Ear: External ear normal.  Left Ear: External ear normal.  Mouth/Throat: Oropharynx is clear and moist.  Eyes: Conjunctivae are normal.  Neck: Normal range of motion. Neck supple.  Cardiovascular: Normal rate and regular  rhythm.   Pulmonary/Chest: Effort normal and breath sounds normal. No respiratory distress. She has no wheezes.  Abdominal: Soft. There is no tenderness.  Neurological: She is alert and oriented to person, place, and time.  Skin: No rash (Urticarial hives noted throughout body most significant to right side of face, neck, upper back, and legs, and arms) noted.  Psychiatric: She has a normal mood and affect.  Nursing note and vitals reviewed.    ED Treatments /  Results  Labs (all labs ordered are listed, but only abnormal results are displayed) Labs Reviewed - No data to display  EKG  EKG Interpretation None       Radiology No results found.  Procedures Procedures (including critical care time)  Medications Ordered in ED Medications  predniSONE (DELTASONE) tablet 60 mg (60 mg Oral Given 04/22/17 1620)  diphenhydrAMINE (BENADRYL) capsule 50 mg (50 mg Oral Given 04/22/17 1620)  famotidine (PEPCID) tablet 20 mg (20 mg Oral Given 04/22/17 1620)     Initial Impression / Assessment and Plan / ED Course  I have reviewed the triage vital signs and the nursing notes.  Pertinent labs & imaging results that were available during my care of the patient were reviewed by me and considered in my medical decision making (see chart for details).     BP 105/79 (BP Location: Right Arm)   Pulse 87   Temp 98.2 F (36.8 C) (Oral)   Resp 18   LMP 04/17/2017   SpO2 98%    Final Clinical Impressions(s) / ED Diagnoses   Final diagnoses:  Urticaria    New Prescriptions New Prescriptions   DIPHENHYDRAMINE (BENADRYL) 25 MG TABLET    Take 1 tablet (25 mg total) by mouth every 6 (six) hours.   PREDNISONE (DELTASONE) 20 MG TABLET    2 tabs po daily x 4 days   3:38 PM Patient here with throat irritation and now having pruritic hives. Her throat exam unremarkable, no evidence of tongue swelling, or mucosal edema or stridor. No obvious airway compromise.  6:33 PM Has been monitored for the past 3 hours. She received medication and felt much better. Her urticaria has resolved. Patient stable for discharge. Patient discharged with prednisone and Benadryl. Return precaution discussed. Referral to allergist given.   Fayrene Helper, PA-C 04/22/17 Nida Boatman    Cathren Laine, MD 04/23/17 [phone]

## 2017-04-22 NOTE — ED Triage Notes (Signed)
Pt to ER for evaluation of throat itching and rash to neck, chest, and abdomen onset last night. States did not eat any strawberry or peach. States did eat shrimp. Airway intact at triage, lung sounds clear.

## 2017-06-15 ENCOUNTER — Emergency Department: Payer: Federal, State, Local not specified - PPO

## 2017-06-15 ENCOUNTER — Encounter: Payer: Self-pay | Admitting: Emergency Medicine

## 2017-06-15 ENCOUNTER — Emergency Department
Admission: EM | Admit: 2017-06-15 | Discharge: 2017-06-16 | Disposition: A | Discharge status: Home / Self Care | Payer: Federal, State, Local not specified - PPO | Attending: Emergency Medicine | Admitting: Emergency Medicine

## 2017-06-15 DIAGNOSIS — F17210 Nicotine dependence, cigarettes, uncomplicated: Secondary | ICD-10-CM | POA: Diagnosis not present

## 2017-06-15 DIAGNOSIS — R079 Chest pain, unspecified: Secondary | ICD-10-CM | POA: Insufficient documentation

## 2017-06-15 DIAGNOSIS — Z79899 Other long term (current) drug therapy: Secondary | ICD-10-CM | POA: Insufficient documentation

## 2017-06-15 LAB — BASIC METABOLIC PANEL
Anion gap: 9 (ref 5–15)
BUN: 9 mg/dL (ref 6–20)
CHLORIDE: 107 mmol/L (ref 101–111)
CO2: 21 mmol/L — ABNORMAL LOW (ref 22–32)
Calcium: 9.3 mg/dL (ref 8.9–10.3)
Creatinine, Ser: 0.75 mg/dL (ref 0.44–1.00)
GFR calc Af Amer: >60 mL/min (ref >60)
GFR calc non Af Amer: >60 mL/min (ref >60)
Glucose, Bld: 85 mg/dL (ref 65–99)
POTASSIUM: 3.4 mmol/L — AB (ref 3.5–5.1)
SODIUM: 137 mmol/L (ref 135–145)

## 2017-06-15 LAB — CBC
HEMATOCRIT: 40.4 % (ref 35.0–47.0)
HEMOGLOBIN: 13.5 g/dL (ref 12.0–16.0)
MCH: 31.1 pg (ref 26.0–34.0)
MCHC: 33.4 g/dL (ref 32.0–36.0)
MCV: 93.1 fL (ref 80.0–100.0)
Platelets: 178 10*3/uL (ref 150–440)
RBC: 4.34 MIL/uL (ref 3.80–5.20)
RDW: 14 % (ref 11.5–14.5)
WBC: 6.8 10*3/uL (ref 3.6–11.0)

## 2017-06-15 LAB — TROPONIN I: Troponin I: <0.03 ng/mL (ref <0.03)

## 2017-06-15 NOTE — ED Notes (Signed)
Dr. Brown at bedside to assess pt

## 2017-06-15 NOTE — ED Notes (Signed)
Pt uprite on stretcher in exam room with no distress noted; pt reports PTA while laying down had onset pressure to mid chest, nonradiating with no accomp symptoms; denies hx of same and denies any c/o at present; resp even/unlab, lungs clear, apical audible & regular, +BS, abd soft/nondist/nontender; friend at bedside

## 2017-06-15 NOTE — ED Triage Notes (Signed)
Pt c/o sudden onset of central chest pressure and the feeling of something in her throat x1 hour. Pt denies eating/swallowing anything. Pt is tearful and poor historian in triage. Pt denies medical hx. Pt denies SOB, N/V.

## 2017-06-15 NOTE — ED Provider Notes (Signed)
[facility] Emergency Department Provider Note    First MD Initiated Contact with Patient 06/15/17 2342     (approximate)  I have reviewed the triage vital signs and the nursing notes.   HISTORY  Chief Complaint Chest Pain    HPI Rebecca Higgins is a 21 y.o. female presents to the emergency department with nonradiating central chest pressure and a sensation of "something in my throat times 1 hour before arrival that is completely resolved at this time.  Patient denies any previous history of the same.  Patient denies any personal family history of CAD.  Patient denies any lower extremity pain or swelling.  Patient denies any shortness of breath or any other concomitant symptoms.  Past medical history None There are no active problems to display for this patient.   Past surgical history None  Prior to Admission medications   Medication Sig Start Date End Date Taking? Authorizing Provider  Ascorbic Acid (VITAMIN C WITH ROSE HIPS) 250 MG tablet Take 250 mg by mouth every other day.   Yes [provider]  cetirizine (ZYRTEC) 10 MG tablet Take 10 mg by mouth daily.   Yes [provider]  Multiple Vitamin (MULTIVITAMIN WITH MINERALS) TABS tablet Take 1 tablet by mouth daily.   Yes [provider]  Omega-3 Fatty Acids (FISH OIL) 1000 MG CAPS Take 1,000 capsules by mouth daily.   Yes [provider]    Allergies Other; Strawberry flavor; and Penicillins  History reviewed. No pertinent family history.  Social History Social History   Tobacco Use  . Smoking status: Current Some Day Smoker  . Smokeless tobacco: Never Used  Substance Use Topics  . Alcohol use: Yes  . Drug use: No    Review of Systems Constitutional: No fever/chills Eyes: No visual changes. ENT: No sore throat. Cardiovascular: Positive for chest pressure (resolved) Respiratory: Denies shortness of breath. Gastrointestinal: No abdominal pain.  No  nausea, no vomiting.  No diarrhea.  No constipation. Genitourinary: Negative for dysuria. Musculoskeletal: Negative for neck pain.  Negative for back pain. Integumentary: Negative for rash. Neurological: Negative for headaches, focal weakness or numbness.  ____________________________________________   PHYSICAL EXAM:  VITAL SIGNS: ED Triage Vitals  Enc Vitals Group     BP 06/15/17 2052 120/80     Pulse Rate 06/15/17 2052 73     Resp 06/15/17 2052 16     Temp 06/15/17 2052 98.6 F (37 C)     Temp Source 06/15/17 2052 Oral     SpO2 06/15/17 2052 100 %     Weight 06/15/17 2049 104.3 kg (230 lb)     Height --      Head Circumference --      Peak Flow --      Pain Score --      Pain Loc --      Pain Edu? --      Excl. in [place]? --     Constitutional: Alert and oriented. Well appearing and in no acute distress. Eyes: Conjunctivae are normal. Head: Atraumatic. Mouth/Throat: Mucous membranes are moist.  Oropharynx non-erythematous. Neck: No stridor.  Cardiovascular: Normal rate, regular rhythm. Good peripheral circulation. Grossly normal heart sounds. Respiratory: Normal respiratory effort.  No retractions. Lungs CTAB. Gastrointestinal: Soft and nontender. No distention.  Musculoskeletal: No lower extremity tenderness nor edema. No gross deformities of extremities. Neurologic:  Normal speech and language. No gross focal neurologic deficits are appreciated.  Skin:  Skin is warm, dry and intact.  No rash noted. Psychiatric: Mood and affect are normal. Speech and behavior are normal.  ____________________________________________   LABS (all labs ordered are listed, but only abnormal results are displayed)  Labs Reviewed  BASIC METABOLIC PANEL - Abnormal; Notable for the following components:      Result Value   Potassium 3.4 (*)    CO2 21 (*)    All other components within normal limits  CBC  TROPONIN I   ____________________________________________  EKG  ED ECG  REPORT I, [place] N BROWN, the attending physician, personally viewed and interpreted this ECG.   Date: 06/16/2017  EKG Time: 8:44 PM  Rate: 69  Rhythm: Normal sinus rhythm  Axis: Normal  Intervals: Normal  ST&T Change: None  ____________________________________________  RADIOLOGY I, [place] N BROWN, personally viewed and evaluated these images (plain radiographs) as part of my medical decision making, as well as reviewing the written report by the radiologist.  Dg Chest 2 View  Result Date: 06/15/2017 CLINICAL DATA:  Chest pain EXAM: CHEST  2 VIEW COMPARISON:  10/05/2016 FINDINGS: The heart size and mediastinal contours are within normal limits. Both lungs are clear. The visualized skeletal structures are unremarkable. IMPRESSION: No active cardiopulmonary disease. Electronically Signed   By: Jasmine PangKim  Fujinaga M.D.   On: 06/15/2017 21:13     Procedures   ____________________________________________   INITIAL IMPRESSION / ASSESSMENT AND PLAN / ED COURSE  As part of my medical decision making, I reviewed the following data within the electronic MEDICAL RECORD [id number] year old female presented with above-stated history of chest pressure which has since resolved.  Consider possibly CAD EKG unremarkable troponin negative x1.  Patient has no risk factors for PE and no symptoms consistent with such.  Consider the possibility of anxiety/panic attack as etiology for the patient's symptoms which the patient's girlfriend at bedside confirms that was her suspicion. ____________________________________________  FINAL CLINICAL IMPRESSION(S) / ED DIAGNOSES  Final diagnoses:  Nonspecific chest pain     MEDICATIONS GIVEN DURING THIS VISIT:  Medications - No data to display   ED Discharge Orders    None       Note:  This document was prepared using Dragon voice recognition software and may include unintentional dictation errors.    Darci CurrentBrown, [place] N, MD 06/16/17 [phone]

## 2017-11-29 IMAGING — CT CT ANGIO CHEST
2 of 6 series · 19 of 36 positions shown · IV contrast (ISOVUE 370)
Comparison: Chest radiograph dated 10/05/2016

CLINICAL DATA: 20-year-old female with chest pain.

EXAM:
CT ANGIOGRAPHY CHEST WITH CONTRAST
TECHNIQUE: Multidetector CT imaging of the chest was performed using the
standard protocol during bolus administration of intravenous
contrast. Multiplanar CT image reconstructions and MIPs were
obtained to evaluate the vascular anatomy.
CONTRAST:  100 cc Isovue 370

[Series 8: thins for pacs · axial · 0.62mm/px · z∈[-136,+76]mm · 18 of 236 slices shown]
[im 12/236  lung]
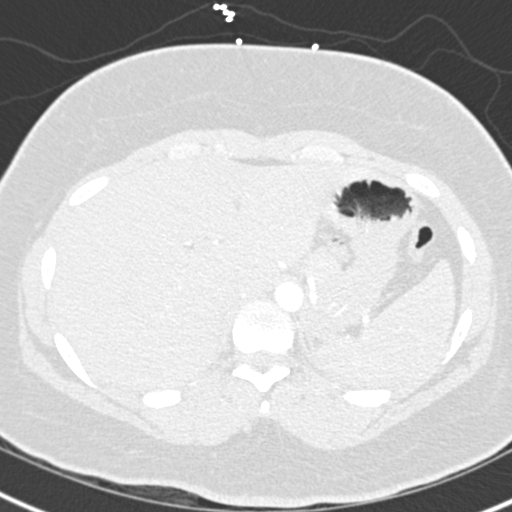
[im 24/236  mediastinal]
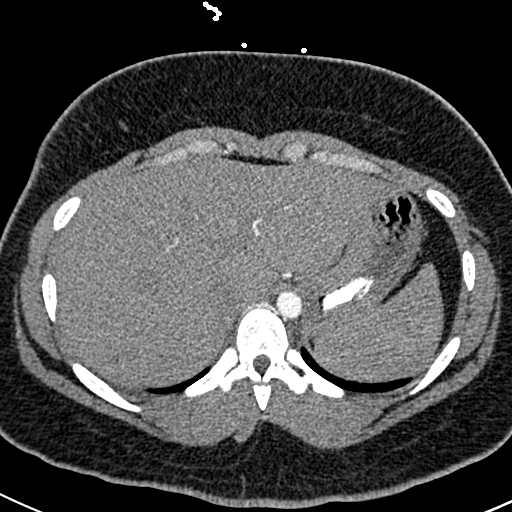
[im 36/236  lung]
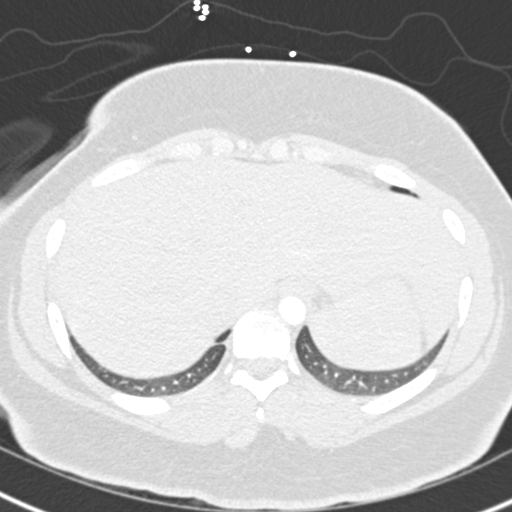
[im 48/236  mediastinal]
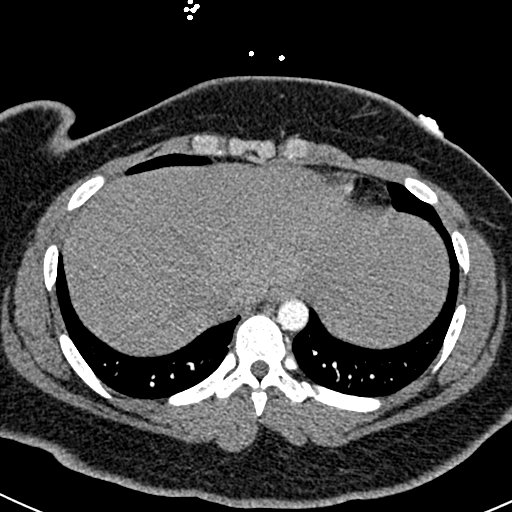
[im 59/236  lung]
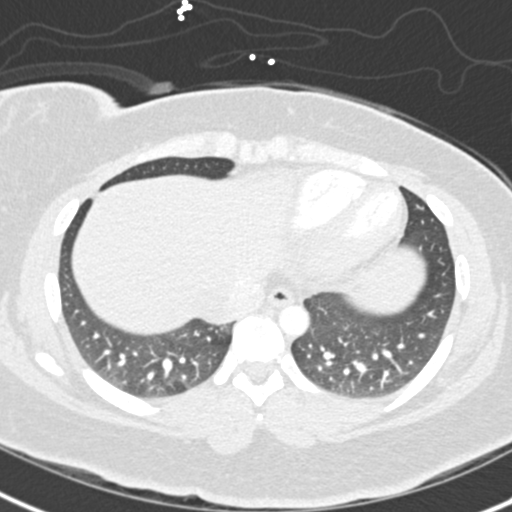
[im 71/236  mediastinal]
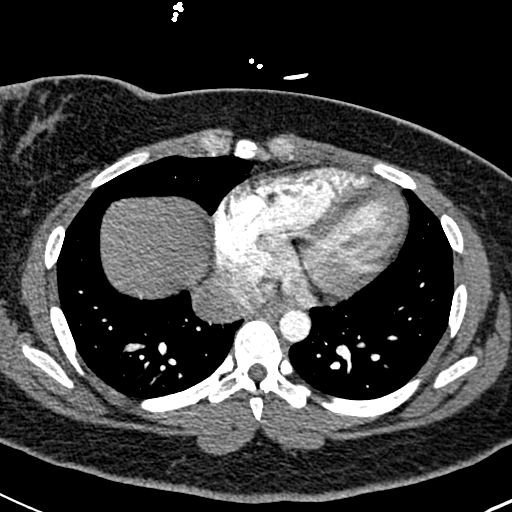
[im 83/236  lung]
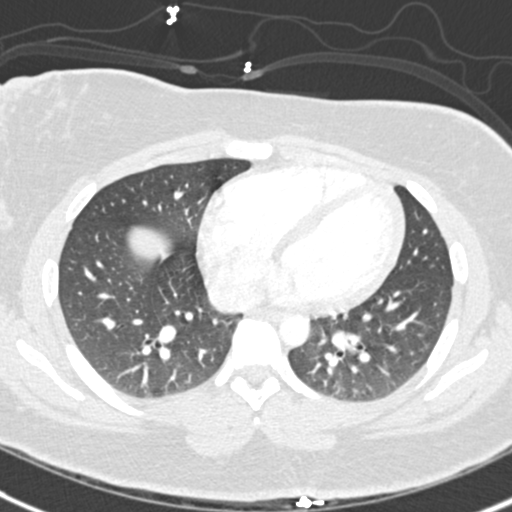
[im 95/236  mediastinal]
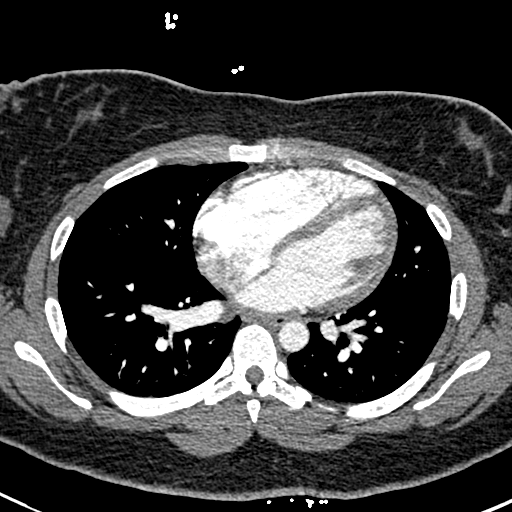
[im 106/236  lung]
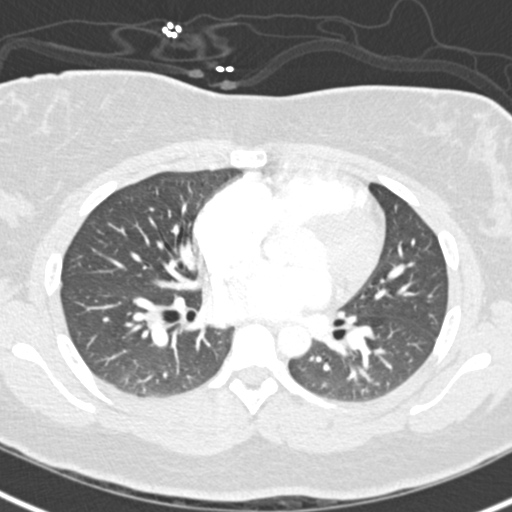
[im 130/236  mediastinal]
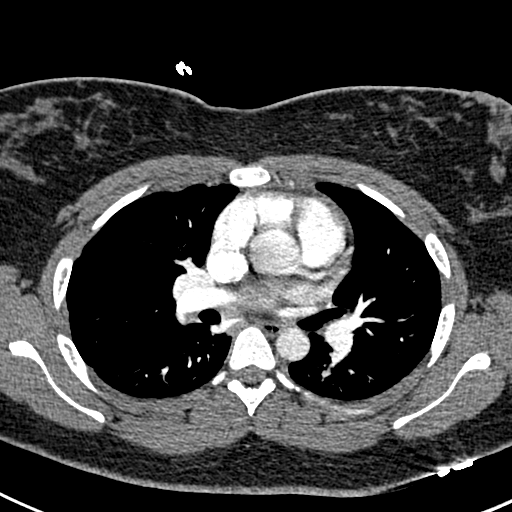
[im 142/236  lung]
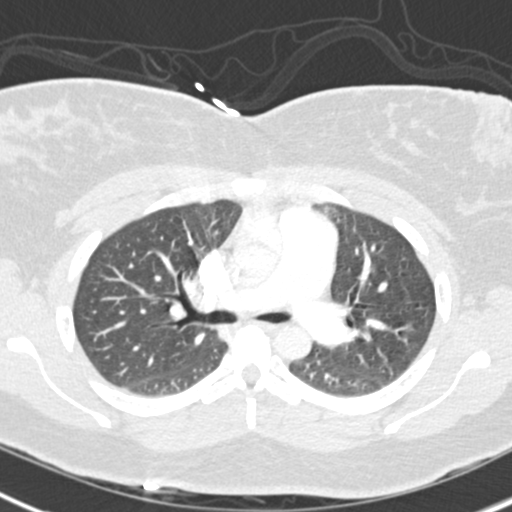
[im 153/236  mediastinal]
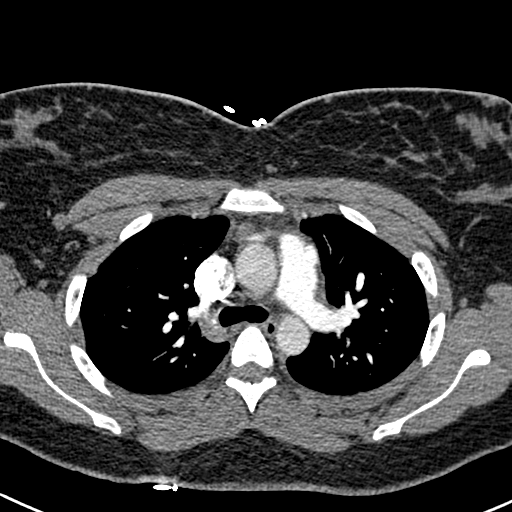
[im 165/236  lung]
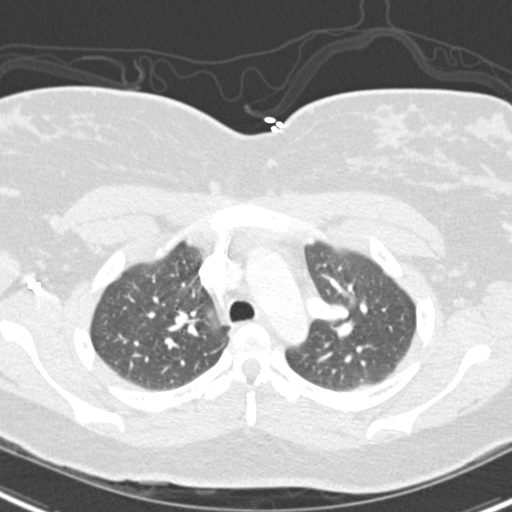
[im 177/236  mediastinal]
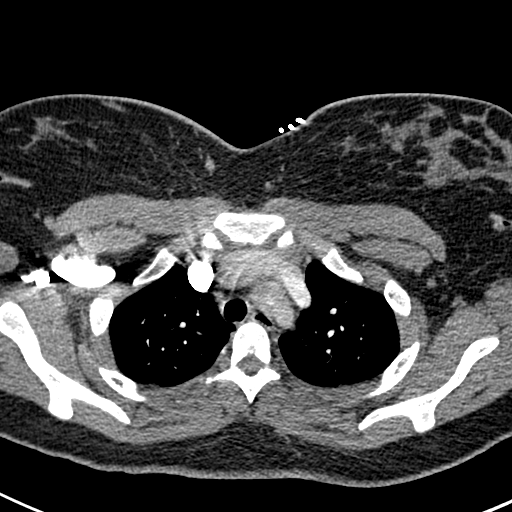
[im 189/236  lung]
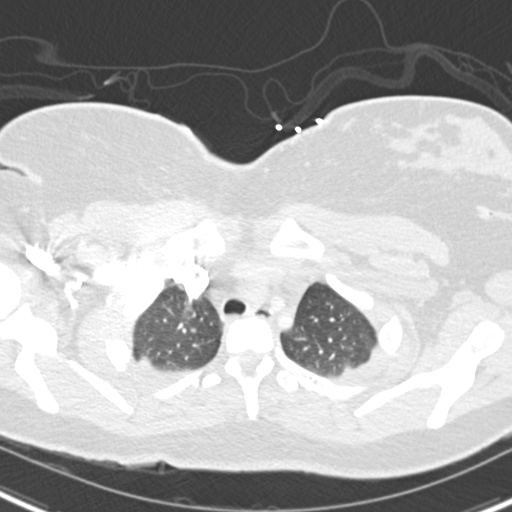
[im 200/236  mediastinal]
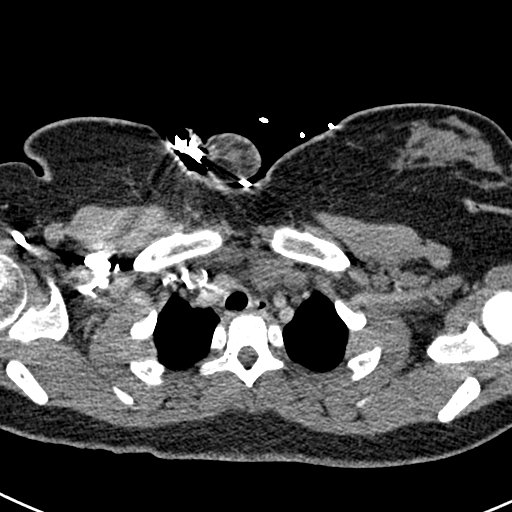
[im 212/236  lung]
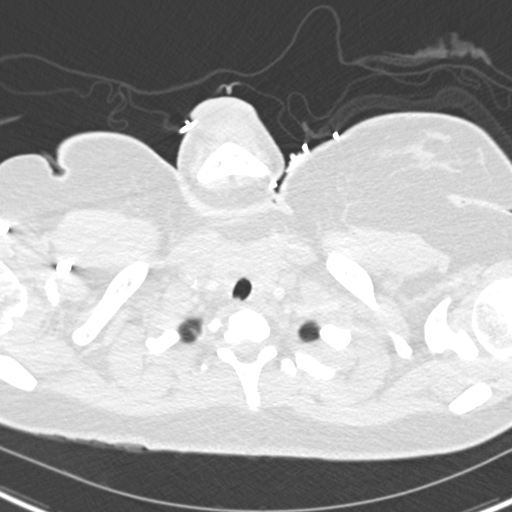
[im 224/236  mediastinal]
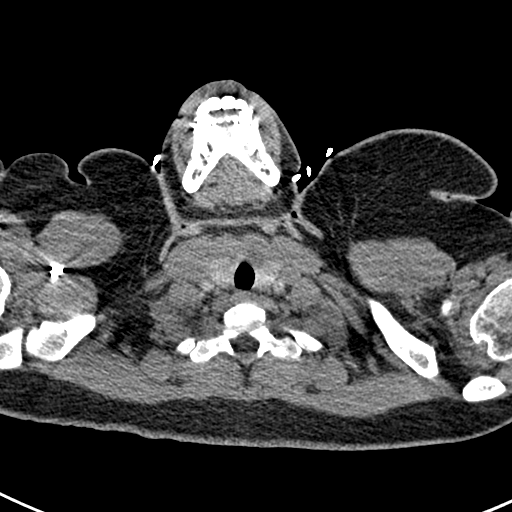

[Series 10: coronal mpr · coronal · 0.46mm/px · 1 of 109 slices shown]
[im 55/109  mediastinal]
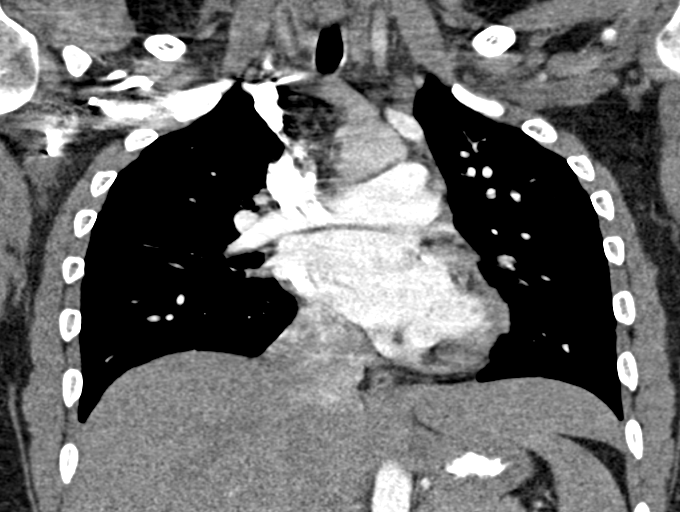

[19 of 36 positions shown; findings below may reference images not displayed]

FINDINGS: Cardiovascular: Top-normal cardiac size. No pericardial effusion.
The thoracic aorta is unremarkable. The origins of the great vessels
of the aortic arch appear patent. Evaluation of the pulmonary
arteries is somewhat limited due to respiratory motion artifact.
There is no CT evidence of pulmonary embolism.

Mediastinum/Nodes: No hilar or mediastinal adenopathy. The esophagus
and the thyroid gland are grossly unremarkable.

Lungs/Pleura: The lungs are clear. There is no pleural effusion or
pneumothorax. The central airways are patent.

Upper Abdomen: No acute abnormality.

Musculoskeletal: No chest wall abnormality. No acute or significant
osseous findings.

Review of the MIP images confirms the above findings.
IMPRESSION: No acute intrathoracic pathology. No CT evidence of pulmonary
embolism.

## 2018-08-09 IMAGING — CR DG CHEST 2V
1 series · 2 of 2 positions shown · non-contrast
Comparison: 10/05/2016

CLINICAL DATA: Chest pain

EXAM:
CHEST  2 VIEW

[Series 1: dg chest 2 view · 0.14mm/px · 2 of 2 slices shown]
[im 1/2]
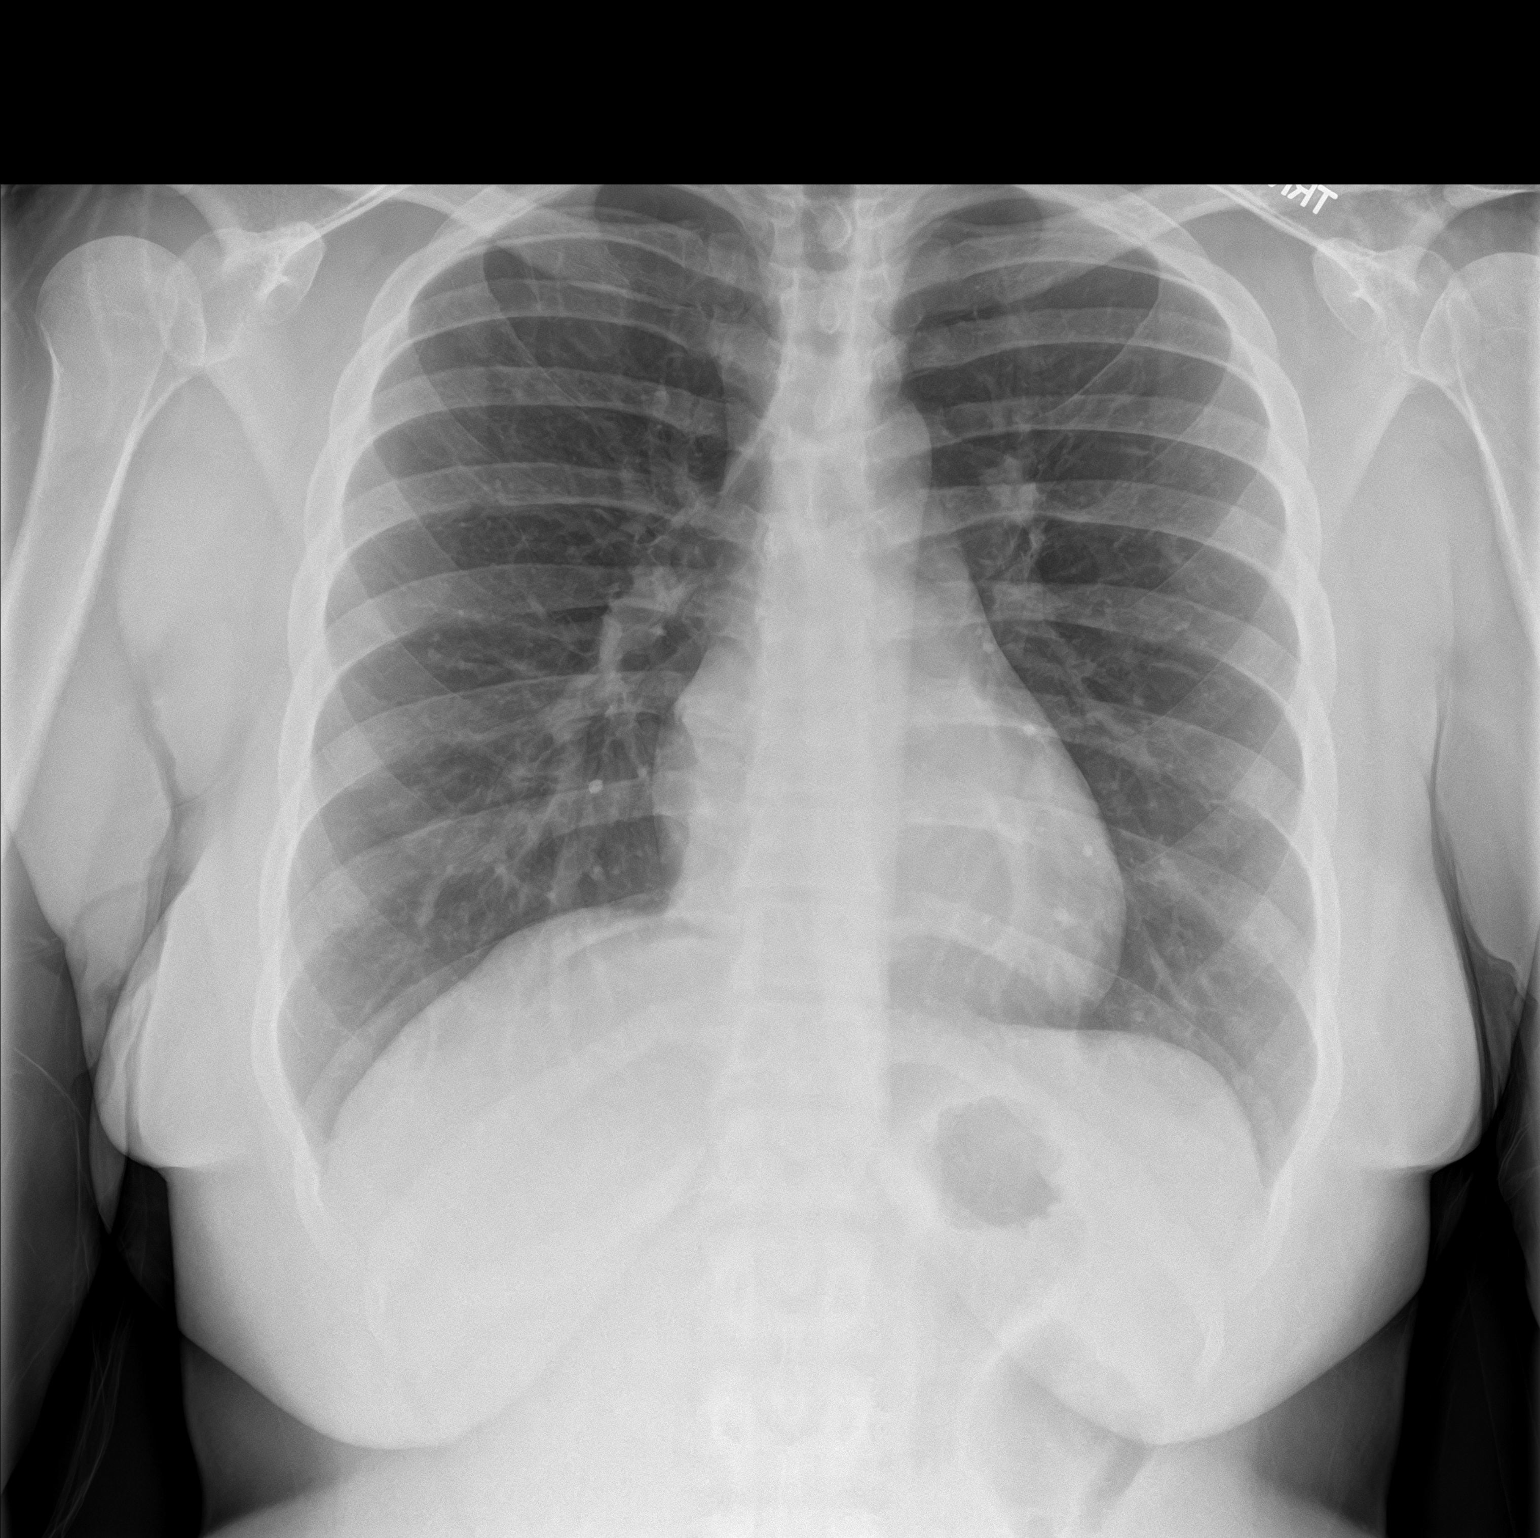
[im 2/2]
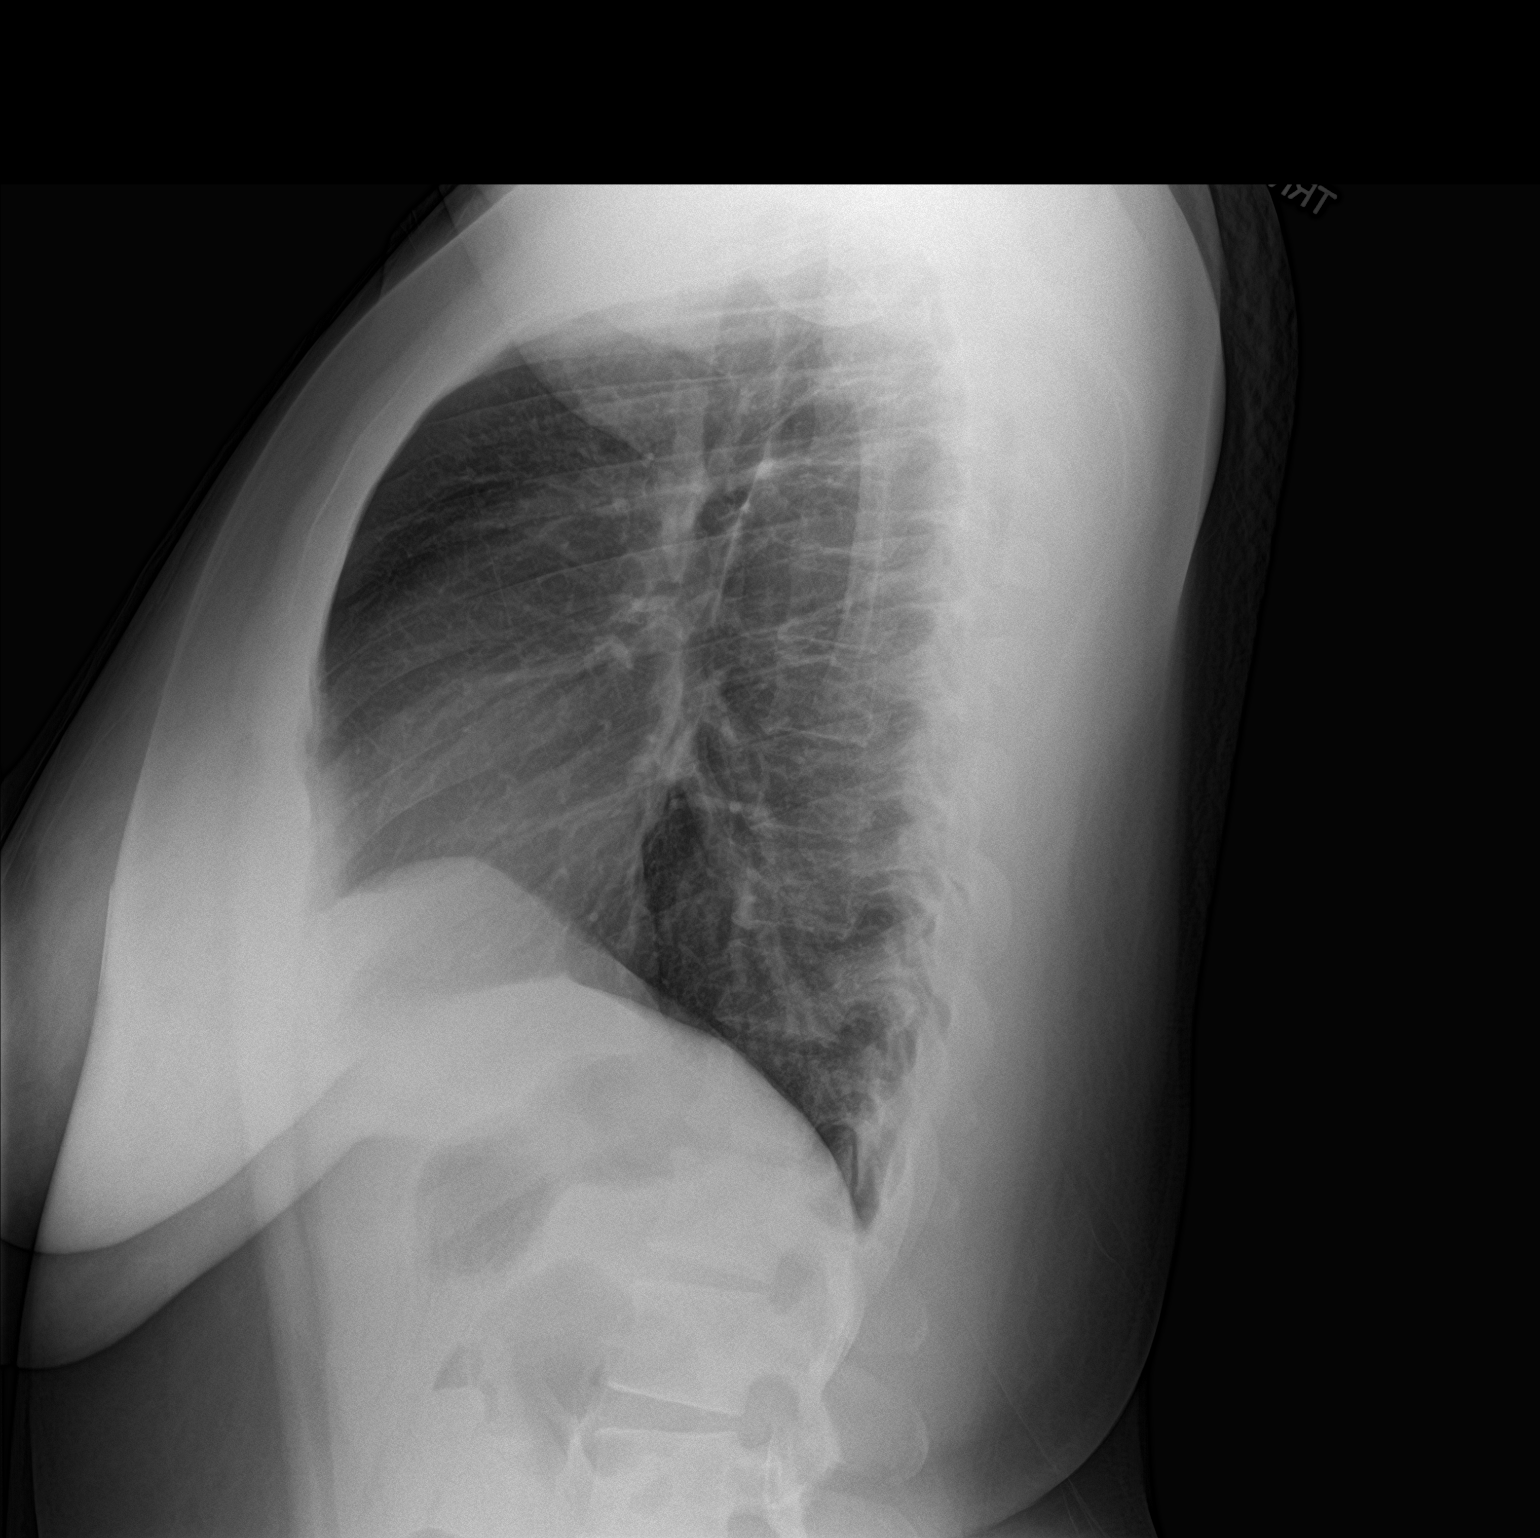

[2 of 2 positions shown; findings below may reference images not displayed]

FINDINGS: The heart size and mediastinal contours are within normal limits.
Both lungs are clear. The visualized skeletal structures are
unremarkable.
IMPRESSION: No active cardiopulmonary disease.

## 2019-09-25 ENCOUNTER — Encounter: Payer: Self-pay | Admitting: Emergency Medicine

## 2019-09-25 ENCOUNTER — Emergency Department
Admission: EM | Admit: 2019-09-25 | Discharge: 2019-09-25 | Disposition: A | Discharge status: Home / Self Care | Payer: Federal, State, Local not specified - PPO | Attending: Student | Admitting: Student

## 2019-09-25 ENCOUNTER — Other Ambulatory Visit: Payer: Self-pay

## 2019-09-25 DIAGNOSIS — N939 Abnormal uterine and vaginal bleeding, unspecified: Secondary | ICD-10-CM | POA: Diagnosis not present

## 2019-09-25 DIAGNOSIS — Z79899 Other long term (current) drug therapy: Secondary | ICD-10-CM | POA: Insufficient documentation

## 2019-09-25 DIAGNOSIS — F17210 Nicotine dependence, cigarettes, uncomplicated: Secondary | ICD-10-CM | POA: Insufficient documentation

## 2019-09-25 LAB — CBC WITH DIFFERENTIAL/PLATELET
Abs Immature Granulocytes: 0.01 10*3/uL (ref 0.00–0.07)
Basophils Absolute: 0 10*3/uL (ref 0.0–0.1)
Basophils Relative: 1 %
Eosinophils Absolute: 0 10*3/uL (ref 0.0–0.5)
Eosinophils Relative: 1 %
HCT: 35.7 % — ABNORMAL LOW (ref 36.0–46.0)
Hemoglobin: 12 g/dL (ref 12.0–15.0)
Immature Granulocytes: 0 %
Lymphocytes Relative: 33 %
Lymphs Abs: 1.9 10*3/uL (ref 0.7–4.0)
MCH: 30.8 pg (ref 26.0–34.0)
MCHC: 33.6 g/dL (ref 30.0–36.0)
MCV: 91.8 fL (ref 80.0–100.0)
Monocytes Absolute: 0.7 10*3/uL (ref 0.1–1.0)
Monocytes Relative: 12 %
Neutro Abs: 3 10*3/uL (ref 1.7–7.7)
Neutrophils Relative %: 53 %
Platelets: 174 10*3/uL (ref 150–400)
RBC: 3.89 MIL/uL (ref 3.87–5.11)
RDW: 13.7 % (ref 11.5–15.5)
WBC: 5.7 10*3/uL (ref 4.0–10.5)
nRBC: 0 % (ref 0.0–0.2)

## 2019-09-25 LAB — URINALYSIS, COMPLETE (UACMP) WITH MICROSCOPIC
Bacteria, UA: NONE SEEN
Bilirubin Urine: NEGATIVE
Glucose, UA: NEGATIVE mg/dL
Ketones, ur: 80 mg/dL — AB
Leukocytes,Ua: NEGATIVE
Nitrite: NEGATIVE
Protein, ur: 100 mg/dL — AB
Specific Gravity, Urine: 1.03 (ref 1.005–1.030)
pH: 5 (ref 5.0–8.0)

## 2019-09-25 LAB — POCT PREGNANCY, URINE: Preg Test, Ur: NEGATIVE

## 2019-09-25 NOTE — ED Notes (Signed)
See triage note  Presents with some vaginal bleeding  States bleeding was heavy at first  Then  States she took a nap   Now only having spotting  Denies any pain

## 2019-09-25 NOTE — Discharge Instructions (Addendum)
Follow-up with Dr. Gaynelle Arabian Return to the emergency department if worsening

## 2019-09-25 NOTE — ED Provider Notes (Signed)
[facility] Emergency Department Provider Note  ____________________________________________   First MD Initiated Contact with Patient 09/25/19 1539     (approximate)  I have reviewed the triage vital signs and the nursing notes.   HISTORY  Chief Complaint Vaginal Bleeding    HPI Rebecca Higgins is a 24 y.o. female presents emergency department complaining of heavy vaginal bleeding with clots this morning.  She states now she is started to only spotting.  States she did feel little weak and so she laid down to take a nap.  States it is not time for her normal period.  She denies any abdominal pain.  No recent pregnancy.  No concerns for STIs.  Denies any fever or chills.    History reviewed. No pertinent past medical history.  There are no problems to display for this patient.   History reviewed. No pertinent surgical history.  Prior to Admission medications   Medication Sig Start Date End Date Taking? Authorizing Provider  Ascorbic Acid (VITAMIN C WITH ROSE HIPS) 250 MG tablet Take 250 mg by mouth every other day.    [provider]  cetirizine (ZYRTEC) 10 MG tablet Take 10 mg by mouth daily.    [provider]  Multiple Vitamin (MULTIVITAMIN WITH MINERALS) TABS tablet Take 1 tablet by mouth daily.    [provider]  Omega-3 Fatty Acids (FISH OIL) 1000 MG CAPS Take 1,000 capsules by mouth daily.    [provider]    Allergies Other, Strawberry flavor, and Penicillins  No family history on file.  Social History Social History   Tobacco Use  . Smoking status: Current Some Day Smoker  . Smokeless tobacco: Never Used  Substance Use Topics  . Alcohol use: Yes  . Drug use: No    Review of Systems  Constitutional: No fever/chills Eyes: No visual changes. ENT: No sore throat. Respiratory: Denies cough Cardiovascular: Denies chest pain Gastrointestinal: Denies abdominal pain Genitourinary: Negative for  dysuria.  Positive for vaginal bleeding Musculoskeletal: Negative for back pain. Skin: Negative for rash. Psychiatric: no mood changes,     ____________________________________________   PHYSICAL EXAM:  VITAL SIGNS: ED Triage Vitals  Enc Vitals Group     BP 09/25/19 1513 134/70     Pulse Rate 09/25/19 1513 69     Resp 09/25/19 1513 16     Temp 09/25/19 1513 98.3 F (36.8 C)     Temp Source 09/25/19 1513 Oral     SpO2 09/25/19 1513 100 %     Weight 09/25/19 1514 220 lb (99.8 kg)     Height 09/25/19 1514 5\' 7"  (1.702 m)     Head Circumference --      Peak Flow --      Pain Score 09/25/19 1514 0     Pain Loc --      Pain Edu? --      Excl. in [place]? --     Constitutional: Alert and oriented. Well appearing and in no acute distress. Eyes: Conjunctivae are normal.  Head: Atraumatic.Marland Kitchen   Neck:  supple no lymphadenopathy noted Cardiovascular: Normal rate, regular rhythm. Heart sounds are normal Respiratory: Normal respiratory effort.  No retractions, lungs c t a  Abd: soft nontender bs normal all 4 quad GU: deferred by the patient Musculoskeletal: FROM all extremities, warm and well perfused Neurologic:  Normal speech and language.  Skin:  Skin is warm, dry and intact. No rash noted. Psychiatric: Mood and affect are normal. Speech and  behavior are normal.  ____________________________________________   LABS (all labs ordered are listed, but only abnormal results are displayed)  Labs Reviewed  URINALYSIS, COMPLETE (UACMP) WITH MICROSCOPIC - Abnormal; Notable for the following components:      Result Value   Color, Urine AMBER (*)    APPearance HAZY (*)    Hgb urine dipstick MODERATE (*)    Ketones, ur 80 (*)    Protein, ur 100 (*)    All other components within normal limits  CBC WITH DIFFERENTIAL/PLATELET - Abnormal; Notable for the following components:   HCT 35.7 (*)    All other components within normal limits  POC URINE PREG, ED  POCT PREGNANCY, URINE    ____________________________________________   ____________________________________________  RADIOLOGY    ____________________________________________   PROCEDURES  Procedure(s) performed: No  Procedures    ____________________________________________   INITIAL IMPRESSION / ASSESSMENT AND PLAN / ED COURSE  Pertinent labs & imaging results that were available during my care of the patient were reviewed by me and considered in my medical decision making (see chart for details).   Patient is 24 year old female presents emergency department complaining of heavy vaginal bleeding earlier this morning which is now turned into spotting.  See HPI  Physical exam shows patient to appear well.  Vitals are normal.  Abdomen is nontender.  DDx: Vaginal bleeding, vaginal bleeding in pregnancy, fibroid  UA shows 80 ketones but is otherwise normal.  POC pregnancy is negative CBC is normal  Explained the findings to the patient.  She is deferring the pelvic exam at this time.  I will refer her to GYN.  She was instructed to return if worsening.  She states she understands will comply.  She is discharged stable condition.   Rebecca Higgins was evaluated in Emergency Department on 09/25/2019 for the symptoms described in the history of present illness. She was evaluated in the context of the global COVID-19 pandemic, which necessitated consideration that the patient might be at risk for infection with the SARS-CoV-2 virus that causes COVID-19. Institutional protocols and algorithms that pertain to the evaluation of patients at risk for COVID-19 are in a state of rapid change based on information released by regulatory bodies including the CDC and federal and state organizations. These policies and algorithms were followed during the patient's care in the ED.   As part of my medical decision making, I reviewed the following data within the electronic MEDICAL RECORD NUMBER Nursing notes reviewed and  incorporated, Labs reviewed , Old chart reviewed, Notes from prior ED visits and [place] Controlled Substance Database  ____________________________________________   FINAL CLINICAL IMPRESSION(S) / ED DIAGNOSES  Final diagnoses:  Vaginal bleeding      NEW MEDICATIONS STARTED DURING THIS VISIT:  New Prescriptions   No medications on file     Note:  This document was prepared using Dragon voice recognition software and may include unintentional dictation errors.    Faythe Ghee, PA-C 09/25/19 1613    Miguel Aschoff., MD 09/25/19 2214

## 2019-09-25 NOTE — ED Triage Notes (Signed)
Patient reports vaginal bleeding with lots of blood clots for 2-3 hours this morning. Patient reports this afternoon she has continued to spot. States normal period 2 weeks ago. Denies abdominal pain, N/V.

## 2020-01-04 ENCOUNTER — Encounter: Attending: Family

## 2020-03-30 ENCOUNTER — Emergency Department: Admit: 2020-03-30 | Payer: MEDICAID

## 2020-03-30 ENCOUNTER — Inpatient Hospital Stay
Admit: 2020-03-30 | Discharge: 2020-03-31 | Disposition: A | Discharge status: Home / Self Care | Payer: MEDICAID | Attending: Emergency Medicine

## 2020-03-30 DIAGNOSIS — N938 Other specified abnormal uterine and vaginal bleeding: Secondary | ICD-10-CM

## 2020-03-30 LAB — URINALYSIS W/MICROSCOPIC
Bilirubin: NEGATIVE
Glucose: NEGATIVE mg/dL
Ketone: 15 mg/dL — AB
Nitrites: NEGATIVE
Specific gravity: 1.027 (ref 1.003–1.030)
Urobilinogen: 1 EU/dL (ref 0.2–1.0)
pH (UA): 5.5 (ref 5.0–8.0)

## 2020-03-30 LAB — CBC WITH AUTOMATED DIFF
ABS. BASOPHILS: 0 10*3/uL (ref 0.0–0.1)
ABS. EOSINOPHILS: 0.1 10*3/uL (ref 0.0–0.4)
ABS. IMM. GRANS.: 0 10*3/uL (ref 0.00–0.04)
ABS. LYMPHOCYTES: 2.2 10*3/uL (ref 0.8–3.5)
ABS. MONOCYTES: 0.7 10*3/uL (ref 0.0–1.0)
ABS. NEUTROPHILS: 2.6 10*3/uL (ref 1.8–8.0)
ABSOLUTE NRBC: 0 10*3/uL (ref 0.00–0.01)
BASOPHILS: 1 % (ref 0–1)
EOSINOPHILS: 1 % (ref 0–7)
HCT: 37.5 % (ref 35.0–47.0)
HGB: 12.6 g/dL (ref 11.5–16.0)
IMMATURE GRANULOCYTES: 0 % (ref 0.0–0.5)
LYMPHOCYTES: 40 % (ref 12–49)
MCH: 31 PG (ref 26.0–34.0)
MCHC: 33.6 g/dL (ref 30.0–36.5)
MCV: 92.1 FL (ref 80.0–99.0)
MONOCYTES: 12 % (ref 5–13)
MPV: 12 FL (ref 8.9–12.9)
NEUTROPHILS: 46 % (ref 32–75)
NRBC: 0 PER 100 WBC
PLATELET: 211 10*3/uL (ref 150–400)
RBC: 4.07 M/uL (ref 3.80–5.20)
RDW: 13.6 % (ref 11.5–14.5)
WBC: 5.5 10*3/uL (ref 3.6–11.0)

## 2020-03-30 LAB — METABOLIC PANEL, COMPREHENSIVE
A-G Ratio: 1.1 (ref 1.1–2.2)
ALT (SGPT): 17 U/L (ref 12–78)
AST (SGOT): 17 U/L (ref 15–37)
Albumin: 4.1 g/dL (ref 3.5–5.0)
Alk. phosphatase: 47 U/L (ref 45–117)
Anion gap: 5 mmol/L (ref 5–15)
BUN/Creatinine ratio: 10 — ABNORMAL LOW (ref 12–20)
BUN: 8 MG/DL (ref 6–20)
Bilirubin, total: 1.3 MG/DL — ABNORMAL HIGH (ref 0.2–1.0)
CO2: 24 mmol/L (ref 21–32)
Calcium: 8.8 MG/DL (ref 8.5–10.1)
Chloride: 111 mmol/L — ABNORMAL HIGH (ref 97–108)
Creatinine: 0.84 MG/DL (ref 0.55–1.02)
GFR est AA: >60 mL/min/{1.73_m2} (ref >60)
GFR est non-AA: >60 mL/min/{1.73_m2} (ref >60)
Globulin: 3.7 g/dL (ref 2.0–4.0)
Glucose: 84 mg/dL (ref 65–100)
Potassium: 3.7 mmol/L (ref 3.5–5.1)
Protein, total: 7.8 g/dL (ref 6.4–8.2)
Sodium: 140 mmol/L (ref 136–145)

## 2020-03-30 LAB — URINE CULTURE HOLD SAMPLE

## 2020-03-30 LAB — HCG URINE, QL. - POC
HCG, Pregnancy, Urine, POC: NEGATIVE
Pregnancy test,urine (POC): NEGATIVE

## 2020-03-30 LAB — URINALYSIS WITH MICROSCOPIC
Bilirubin, Urine: NEGATIVE
Glucose, Ur: NEGATIVE mg/dL
Ketones, Urine: 15 mg/dL — AB
Nitrite, Urine: NEGATIVE
Specific Gravity, UA: 1.027 (ref 1.003–1.030)
Urobilinogen, UA, POCT: 1 EU/dL (ref 0.2–1.0)
pH, UA: 5.5 (ref 5.0–8.0)

## 2020-03-30 LAB — COMPREHENSIVE METABOLIC PANEL
ALT: 17 U/L (ref 12–78)
AST: 17 U/L (ref 15–37)
Albumin/Globulin Ratio: 1.1 (ref 1.1–2.2)
Albumin: 4.1 g/dL (ref 3.5–5.0)
Alkaline Phosphatase: 47 U/L (ref 45–117)
Anion Gap: 5 mmol/L (ref 5–15)
BUN: 8 MG/DL (ref 6–20)
Bun/Cre Ratio: 10 — ABNORMAL LOW (ref 12–20)
CO2: 24 mmol/L (ref 21–32)
Calcium: 8.8 MG/DL (ref 8.5–10.1)
Chloride: 111 mmol/L — ABNORMAL HIGH (ref 97–108)
Creatinine: 0.84 MG/DL (ref 0.55–1.02)
EGFR IF NonAfrican American: >60 mL/min/{1.73_m2} (ref >60)
GFR African American: >60 mL/min/{1.73_m2} (ref >60)
Globulin: 3.7 g/dL (ref 2.0–4.0)
Glucose: 84 mg/dL (ref 65–100)
Potassium: 3.7 mmol/L (ref 3.5–5.1)
Sodium: 140 mmol/L (ref 136–145)
Total Bilirubin: 1.3 MG/DL — ABNORMAL HIGH (ref 0.2–1.0)
Total Protein: 7.8 g/dL (ref 6.4–8.2)

## 2020-03-30 LAB — CBC WITH AUTO DIFFERENTIAL
Basophils %: 1 % (ref 0–1)
Basophils Absolute: 0 10*3/uL (ref 0.0–0.1)
Eosinophils %: 1 % (ref 0–7)
Eosinophils Absolute: 0.1 10*3/uL (ref 0.0–0.4)
Granulocyte Absolute Count: 0 10*3/uL (ref 0.00–0.04)
Hematocrit: 37.5 % (ref 35.0–47.0)
Hemoglobin: 12.6 g/dL (ref 11.5–16.0)
Immature Granulocytes: 0 % (ref 0.0–0.5)
Lymphocytes %: 40 % (ref 12–49)
Lymphocytes Absolute: 2.2 10*3/uL (ref 0.8–3.5)
MCH: 31 PG (ref 26.0–34.0)
MCHC: 33.6 g/dL (ref 30.0–36.5)
MCV: 92.1 FL (ref 80.0–99.0)
MPV: 12 FL (ref 8.9–12.9)
Monocytes %: 12 % (ref 5–13)
Monocytes Absolute: 0.7 10*3/uL (ref 0.0–1.0)
NRBC Absolute: 0 10*3/uL (ref 0.00–0.01)
Neutrophils %: 46 % (ref 32–75)
Neutrophils Absolute: 2.6 10*3/uL (ref 1.8–8.0)
Nucleated RBCs: 0 PER 100 WBC
Platelets: 211 10*3/uL (ref 150–400)
RBC: 4.07 M/uL (ref 3.80–5.20)
RDW: 13.6 % (ref 11.5–14.5)
WBC: 5.5 10*3/uL (ref 3.6–11.0)

## 2020-03-30 MED ORDER — SODIUM CHLORIDE 0.9% BOLUS IV
0.9 % | Freq: Once | INTRAVENOUS | Status: AC
Start: 2020-03-30 — End: 2020-03-30
  Administered 2020-03-30: 23:00:00 via INTRAVENOUS

## 2020-03-30 MED FILL — SODIUM CHLORIDE 0.9 % IV: INTRAVENOUS | Qty: 1000

## 2020-03-30 NOTE — ED Notes (Signed)
Pt ambulatory to ED with c/o vaginal bleeeding with clots onset 7 days ago. Pt reports "I've never had a cycle this heavy". Pt reports using 1-2 pads/day.

## 2020-03-30 NOTE — ED Provider Notes (Signed)
ED Provider Notes by Donzetta Kohut, NP at 03/30/20 1757                Author: Donzetta Kohut, NP  Service: EMERGENCY  Author Type: Nurse Practitioner       Filed: 03/31/20 0338  Date of Service: 03/30/20 1757  Status: Attested           Editor: Donzetta Kohut, NP (Nurse Practitioner)  Cosigner: Bari Mantis, MD at 04/02/20 [phone]          Attestation signed by Bari Mantis, MD at 04/02/20 0913          I was personally available for consultation in the emergency department.  I have reviewed the chart and agree with the documentation recorded by the [facility], including  the assessment, treatment plan, and disposition.   Bari Mantis, MD                                    HPI       Lisa Ballard is a 24 y.o.  female with Hx of UTI who presents ambulatory to [facility] ED with cc of atypical vaginal bleeding.  Patient reports that she is concerned because she has clotting associated with vaginal bleeding over the  last week up to 1-2 PPD.  She reports that she was spotting the week before.  In the week prior to that she was having pelvic cramping.  She reports some concern because she saw a "yellowish type color" mixed in with the clotting today.  She denies any  vaginal discharge.  She has not obtained any home pregnancy test.  Her last menstrual period was February 13, 2020.  She had women's health care via a clinic in [place] prior to moving back to [place].  Her last pelvic was in January 2021 and she  states this was normal.  She has had some associated nausea but not vomiting.      Denies F/C, V/D, cough, congestion, CP, SOB, or urinary symptoms. Denies concern for STI.             PCP: None      There are no other complaints, changes or physical findings at this time.                    Past Medical History:        Diagnosis  Date         ?  Ill-defined condition            uti             Past Surgical History:         Procedure  Laterality  Date          ?  HX HEENT              tonsilectomy              History reviewed. No pertinent family history.        Social History          Socioeconomic History         ?  Marital status:  SINGLE              Spouse name:  Not on file         ?  Number of children:  Not on file         ?  Years of education:  Not on file         ?  Highest education level:  Not on file       Occupational History        ?  Not on file       Tobacco Use         ?  Smoking status:  Never Smoker     ?  Smokeless tobacco:  Never Used       Substance and Sexual Activity         ?  Alcohol use:  Yes             Comment: occasionally          ?  Drug use:  Yes              Types:  Marijuana             Comment: last used 5/21         ?  Sexual activity:  Not on file        Other Topics  Concern        ?  Not on file       Social History Narrative        ?  Not on file          Social Determinants of Health          Financial Resource Strain:         ?  Difficulty of Paying Living Expenses:        Food Insecurity:         ?  Worried About Charity fundraiser in the Last Year:      ?  Arboriculturist in the Last Year:        Transportation Needs:         ?  Film/video editor (Medical):      ?  Lack of Transportation (Non-Medical):        Physical Activity:         ?  Days of Exercise per Week:      ?  Minutes of Exercise per Session:        Stress:         ?  Feeling of Stress :        Social Connections:         ?  Frequency of Communication with Friends and Family:      ?  Frequency of Social Gatherings with Friends and Family:      ?  Attends Religious Services:      ?  Active Member of Clubs or Organizations:      ?  Attends Archivist Meetings:      ?  Marital Status:        Intimate Partner Violence:         ?  Fear of Current or Ex-Partner:      ?  Emotionally Abused:      ?  Physically Abused:         ?  Sexually Abused:               ALLERGIES: Pcn [penicillins], Peach flavor, and Strawberry      Review of Systems    Constitutional: Negative for activity change,  appetite change, chills and fever.    HENT: Negative for congestion, rhinorrhea and sore throat.     Eyes: Negative for  visual disturbance.    Respiratory: Negative for cough and shortness of breath.     Cardiovascular: Negative for chest pain.    Gastrointestinal: Negative for abdominal pain, diarrhea, nausea and vomiting.    Genitourinary: Positive for menstrual problem, pelvic pain , vaginal bleeding and vaginal discharge . Negative for dysuria, flank pain, frequency and urgency.    Musculoskeletal: Negative for arthralgias, back pain, gait problem, joint swelling, myalgias and neck pain.    Skin: Negative for color change and rash.    Neurological: Negative for dizziness, weakness, light-headedness, numbness and headaches.    Psychiatric/Behavioral: Negative for agitation, behavioral problems and confusion.    All other systems reviewed and are negative.           Vitals:          03/30/20 1740        BP:  107/62     Pulse:  73     Resp:  16     Temp:  98.1 ??F (36.7 ??C)     SpO2:  99%     Weight:  97.5 kg (214 lb 15.2 oz)        Height:  5' 7" (1.702 m)                Physical Exam   Vitals and nursing note reviewed.   Constitutional:        General: She is not in acute distress.     Appearance: She is well-developed.    HENT:       Head: Normocephalic and atraumatic.      Right Ear: External ear normal.      Left Ear: External ear normal.    Eyes:       Conjunctiva/sclera: Conjunctivae normal.      Pupils: Pupils are equal, round, and reactive to light.   Cardiovascular:       Rate and Rhythm: Normal rate and regular rhythm.      Heart sounds: Normal heart sounds.    Pulmonary:       Effort: Pulmonary effort is normal.      Breath sounds: Normal breath sounds.   Abdominal :      Palpations: Abdomen is soft.      Tenderness: There is no abdominal tenderness. There is no guarding or rebound.     Musculoskeletal:          General: Normal range of motion.      Cervical back: Normal range of motion and neck supple.     Skin:      General: Skin is warm and dry.   Neurological :       Mental Status: She is alert and oriented to person, place, and time.    Psychiatric:         Behavior: Behavior normal.         Thought Content: Thought content normal.         Judgment: Judgment normal.              MDM   Number of Diagnoses or Management Options   DUB (dysfunctional uterine bleeding)   Urinary tract infection with hematuria,  site unspecified   Diagnosis management comments: Ddx: anemia, DUB, fibroid       Patient with concern for 2 weeks of vaginal bleeding.  The first week was spotting, now she is having consistent daily vaginal bleeding.  She states that she has saturated up to 1-2 pads a day.  There  are no acute findings on her ultrasound.  She is not  pregnant.  Her hemoglobin and hematocrit are stable.  I have advised that she needs to follow-up with an OB/GYN for further management.  I have given her a list of OB/GYN's.  Reasons to return to the ER were provided.          Amount and/or Complexity of Data Reviewed   Clinical lab tests: ordered and reviewed   Tests in the radiology section of CPT??: ordered and reviewed   Review and summarize past medical records: yes                Procedures      LABORATORY TESTS:     Recent Results (from the past 12 hour(s))     HCG URINE, QL. - POC          Collection Time: 03/30/20  6:16 PM         Result  Value  Ref Range            Pregnancy test,urine (POC)  Negative  NEG         CBC WITH AUTOMATED DIFF          Collection Time: 03/30/20  6:19 PM         Result  Value  Ref Range            WBC  5.5  3.6 - 11.0 K/uL       RBC  4.07  3.80 - 5.20 M/uL       HGB  12.6  11.5 - 16.0 g/dL       HCT  37.5  35.0 - 47.0 %       MCV  92.1  80.0 - 99.0 FL       MCH  31.0  26.0 - 34.0 PG       MCHC  33.6  30.0 - 36.5 g/dL       RDW  13.6  11.5 - 14.5 %       PLATELET  211  150 - 400 K/uL       MPV  12.0  8.9 - 12.9 FL       NRBC  0.0  0 PER 100 WBC       ABSOLUTE NRBC  0.00  0.00 - 0.01 K/uL        NEUTROPHILS  46  32 - 75 %       LYMPHOCYTES  40  12 - 49 %       MONOCYTES  12  5 - 13 %       EOSINOPHILS  1  0 - 7 %       BASOPHILS  1  0 - 1 %       IMMATURE GRANULOCYTES  0  0.0 - 0.5 %       ABS. NEUTROPHILS  2.6  1.8 - 8.0 K/UL       ABS. LYMPHOCYTES  2.2  0.8 - 3.5 K/UL       ABS. MONOCYTES  0.7  0.0 - 1.0 K/UL       ABS. EOSINOPHILS  0.1  0.0 - 0.4 K/UL       ABS. BASOPHILS  0.0  0.0 - 0.1 K/UL       ABS. IMM. GRANS.  0.0  0.00 - 0.04 K/UL       DF  AUTOMATED          METABOLIC PANEL, COMPREHENSIVE  Collection Time: 03/30/20  6:19 PM         Result  Value  Ref Range            Sodium  140  136 - 145 mmol/L       Potassium  3.7  3.5 - 5.1 mmol/L       Chloride  111 (H)  97 - 108 mmol/L       CO2  24  21 - 32 mmol/L       Anion gap  5  5 - 15 mmol/L       Glucose  84  65 - 100 mg/dL       BUN  8  6 - 20 MG/DL       Creatinine  0.84  0.55 - 1.02 MG/DL       BUN/Creatinine ratio  10 (L)  12 - 20         GFR est AA  >60  >60 ml/min/1.58m       GFR est non-AA  >60  >60 ml/min/1.769m      Calcium  8.8  8.5 - 10.1 MG/DL       Bilirubin, total  1.3 (H)  0.2 - 1.0 MG/DL       ALT (SGPT)  17  12 - 78 U/L       AST (SGOT)  17  15 - 37 U/L       Alk. phosphatase  47  45 - 117 U/L       Protein, total  7.8  6.4 - 8.2 g/dL       Albumin  4.1  3.5 - 5.0 g/dL       Globulin  3.7  2.0 - 4.0 g/dL       A-G Ratio  1.1  1.1 - 2.2         URINALYSIS W/MICROSCOPIC          Collection Time: 03/30/20  6:19 PM         Result  Value  Ref Range            Color  YELLOW/STRAW          Appearance  CLEAR  CLEAR         Specific gravity  1.027  1.003 - 1.030         pH (UA)  5.5  5.0 - 8.0         Protein  TRACE (A)  NEG mg/dL       Glucose  Negative  NEG mg/dL       Ketone  15 (A)  NEG mg/dL       Bilirubin  Negative  NEG         Blood  LARGE (A)  NEG         Urobilinogen  1.0  0.2 - 1.0 EU/dL       Nitrites  Negative  NEG         Leukocyte Esterase  TRACE (A)  NEG         WBC  5-10  0 - 4 /hpf       RBC  0-5  0 - 5 /hpf        Epithelial cells  MODERATE (A)  FEW /lpf       Bacteria  1+ (A)  NEG /hpf       Mucus  1+ (A)  NEG /lpf       URINE  CULTURE HOLD SAMPLE          Collection Time: 03/30/20  6:19 PM       Specimen: Serum; Urine         Result  Value  Ref Range            Urine culture hold                  Urine on hold in Microbiology dept for 2 days.  If unpreserved urine is submitted, it cannot be used for addtional testing after 24 hours, recollection  will be required.           IMAGING RESULTS:     [place] UTS TRANSVAGINAL OB       Final Result     Pregnancy of unknown location.. Transvaginal sonography will be     performed to better evaluate.           TRANSVAGINAL TECHNIQUE: Transvaginal sonography was performed with multiple     static images of the uterus and ovaries obtained.           FINDINGS:      UTERUS:     The uterus is normal. The uterus measures 7.3 x 3.2 x 4.3 cm..           ENDOMETRIUM:     The endometrial stripe measures 5.6 mm and contains a tiny amount of complex     fluid.. The gestational sac, fetal pole, yolk sac, heart rate, placenta, and     amniotic fluid are not identified.          RIGHT OVARY:     The right ovary is normal. The right ovary measures 3.9 x 2.3 x 3.7 cm.  There     is normal color flow to the right ovary.  An involuting right ovarian cyst is     noted          LEFT OVARY:     The left ovary is normal. The left ovary measures 3.1 x 2.0 x 2.0 cm. There is     normal color flow to the left ovary.          CUL-DE-SAC:     There is no fluid or mass or other abnormality in the pelvic cul-de-sac.          IMPRESSION:           Pregnancy of unknown location. Consider early pregnancy, miscarriage, or     nonvisualized pregnancy.                                [place] PREG UTS < 14 WKS SNGL       Final Result     Pregnancy of unknown location.. Transvaginal sonography will be     performed to better evaluate.           TRANSVAGINAL TECHNIQUE: Transvaginal sonography was performed with multiple     static  images of the uterus and ovaries obtained.           FINDINGS:      UTERUS:       The uterus is normal. The uterus measures 7.3 x 3.2 x 4.3 cm..             ENDOMETRIUM:     The endometrial stripe measures 5.6 mm and contains a tiny amount of complex     fluid.. The gestational sac, fetal  pole, yolk sac, heart rate, placenta, and     amniotic fluid are not identified.          RIGHT OVARY:     The right ovary is normal. The right ovary measures 3.9 x 2.3 x 3.7 cm.  There     is normal color flow to the right ovary.  An involuting right ovarian cyst is     noted          LEFT OVARY:     The left ovary is normal. The left ovary measures 3.1 x 2.0 x 2.0 cm. There is     normal color flow to the left ovary.          CUL-DE-SAC:     There is no fluid or mass or other abnormality in the pelvic cul-de-sac.          IMPRESSION:           Pregnancy of unknown location. Consider early pregnancy, miscarriage, or     nonvisualized pregnancy.                                      MEDICATIONS GIVEN:     Medications       sodium chloride 0.9 % bolus infusion 1,000 mL (0 mL IntraVENous IV Completed 03/30/20 2004)           IMPRESSION:      1.  DUB (dysfunctional uterine bleeding)         2.  Urinary tract infection with hematuria, site unspecified            PLAN:   1.      Discharge Medication List as of 03/30/2020  8:10 PM              START taking these medications          Details        nitrofurantoin, macrocrystal-monohydrate, (Macrobid) 100 mg capsule  Take 1 Capsule by mouth two (2) times a day for 3 days., Print, Disp-6 Capsule, R-0                     CONTINUE these medications which have NOT CHANGED          Details        OTHER  Historical Med                      2.      Follow-up Information               Follow up With  Specialties  Details  Why  Contact Info              [facility]  Emergency Medicine  Go to   As needed, If symptoms worsen  [place]               [facility]    Schedule an appointment as soon as possible for a visit     [address]   [place]    Schedule an appointment as soon as possible for a visit     [place]   [phone]  [facility] [place]    Schedule an appointment as soon as possible for a visit     [address] [place]   [phone]             3. Return to ED if worse

## 2020-03-31 MED ORDER — NITROFURANTOIN (25% MACROCRYSTAL FORM) 100 MG CAP
100 mg | ORAL_CAPSULE | Freq: Two times a day (BID) | ORAL | 0 refills | Status: AC
Start: 2020-03-31 — End: 2020-04-02
